# Patient Record
Sex: Female | Born: 1990 | Race: White | Hispanic: Yes | Marital: Single | Smoking: Never smoker
Health system: Southern US, Community
[De-identification: ages and names within clinical notes are randomized; demographics above are authoritative.]

## PROBLEM LIST (undated history)

## (undated) ENCOUNTER — Inpatient Hospital Stay (HOSPITAL_COMMUNITY): Payer: Self-pay

## (undated) DIAGNOSIS — E119 Type 2 diabetes mellitus without complications: Secondary | ICD-10-CM

## (undated) DIAGNOSIS — Z789 Other specified health status: Secondary | ICD-10-CM

## (undated) DIAGNOSIS — O1490 Unspecified pre-eclampsia, unspecified trimester: Secondary | ICD-10-CM

## (undated) DIAGNOSIS — F419 Anxiety disorder, unspecified: Secondary | ICD-10-CM

## (undated) HISTORY — PX: NO PAST SURGERIES: SHX2092

## (undated) HISTORY — PX: TUBAL LIGATION: SHX77

---
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
## 2010-02-12 ENCOUNTER — Emergency Department (HOSPITAL_COMMUNITY)
Admission: EM | Admit: 2010-02-12 | Discharge: 2010-02-12 | Payer: Self-pay | Source: Home / Self Care | Admitting: Emergency Medicine

## 2010-02-15 IMAGING — US US RENAL
1 series · 14 of 25 positions shown · non-contrast
Comparison: None available.

CLINICAL DATA: Pyelonephritis.

RENAL/URINARY TRACT ULTRASOUND COMPLETE

[Series 1: us renal · 0.22mm/px · 14 of 38 slices shown]
[im 1/38]
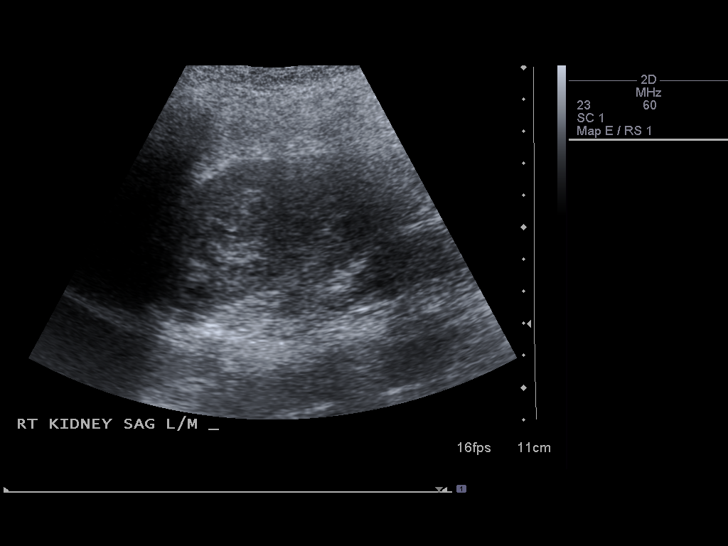
[im 4/38]
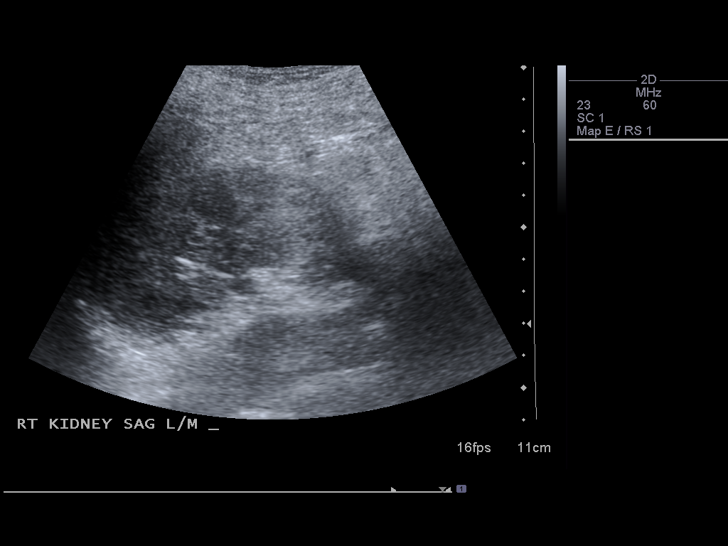
[im 7/38]
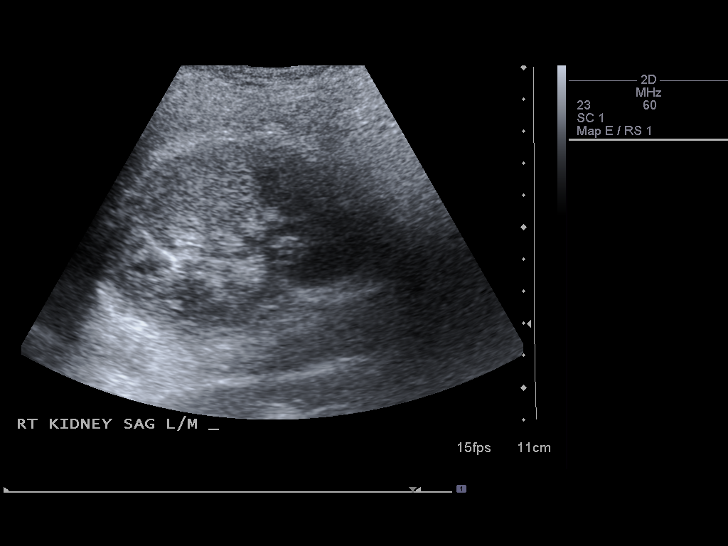
[im 10/38]
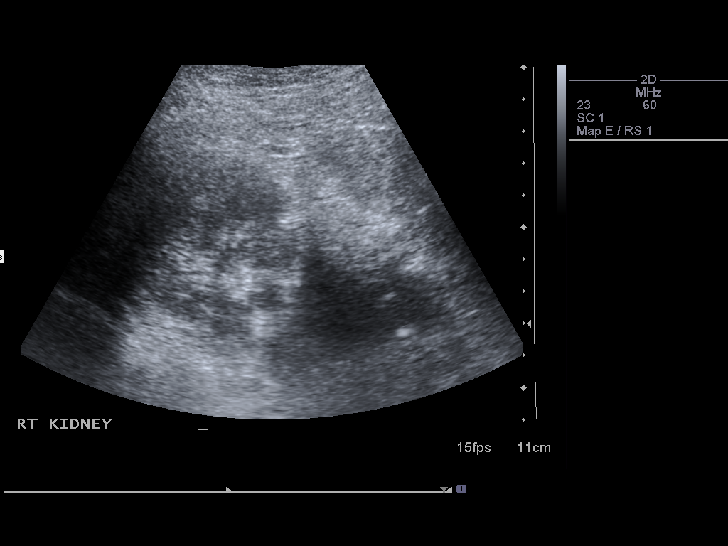
[im 13/38]
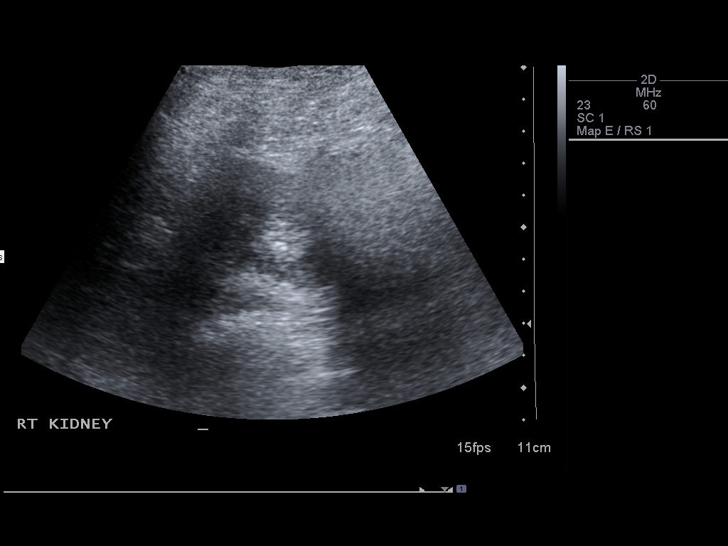
[im 14/38]
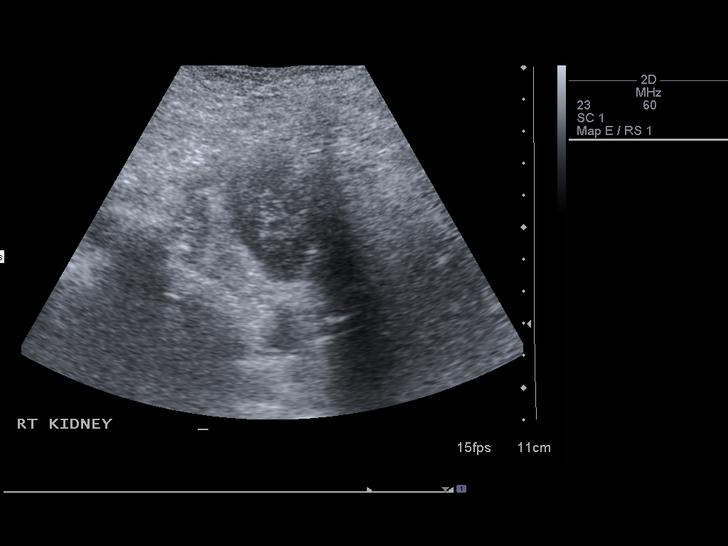
[im 17/38]
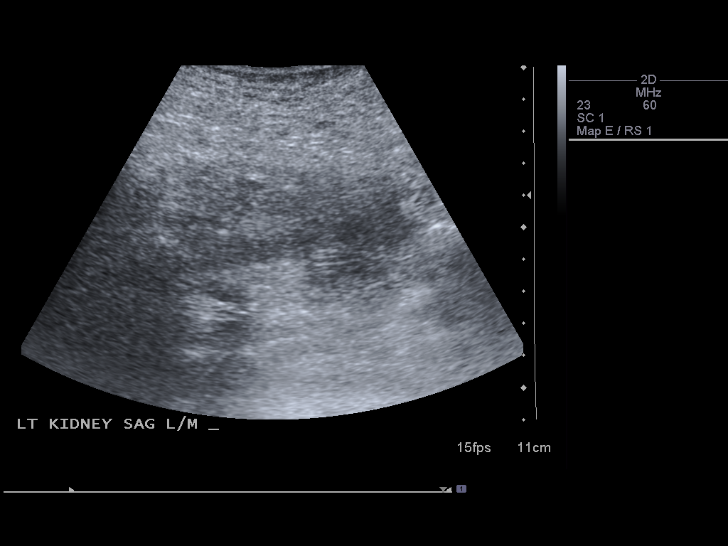
[im 21/38]
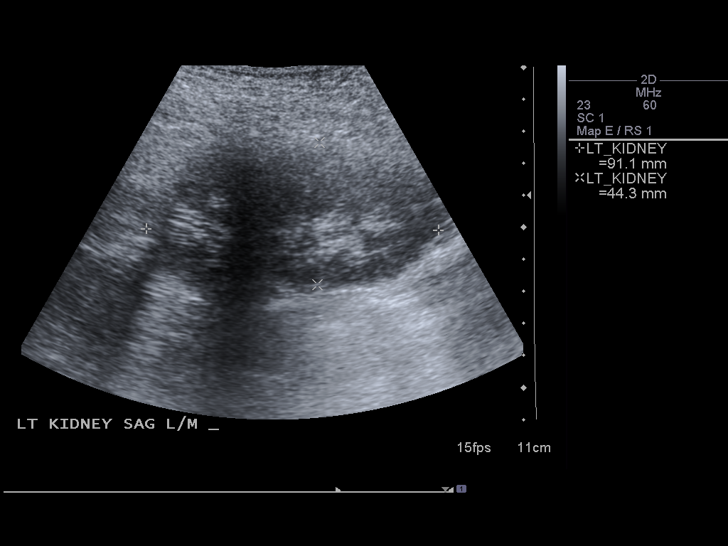
[im 24/38]
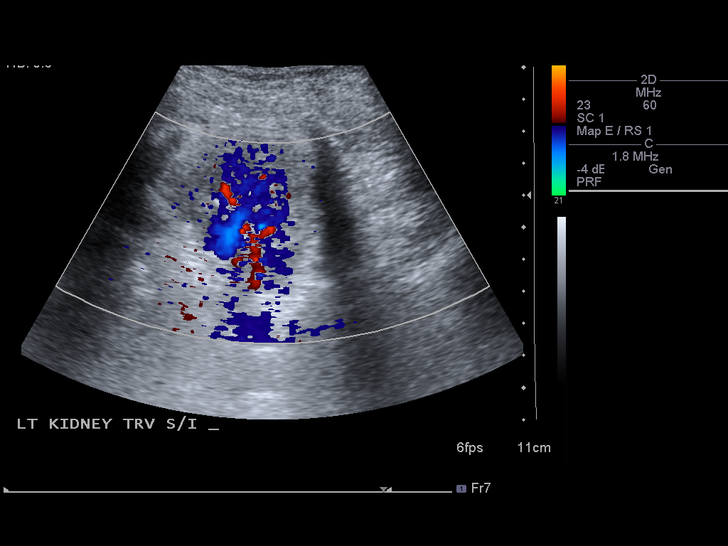
[im 25/38]
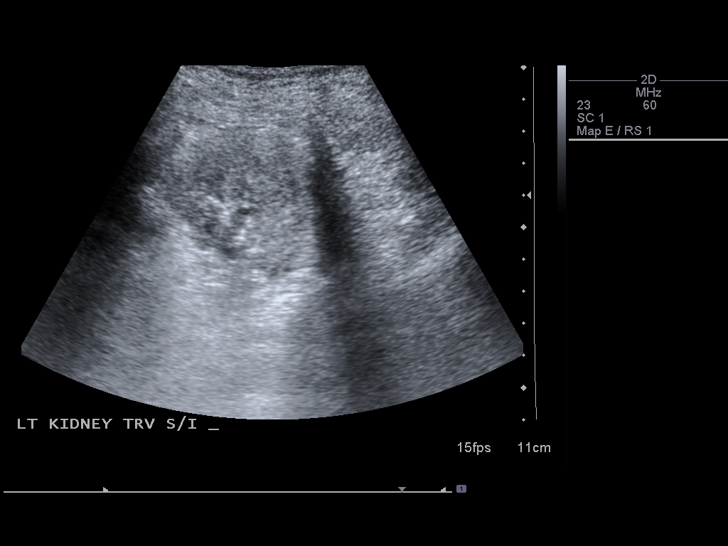
[im 28/38]
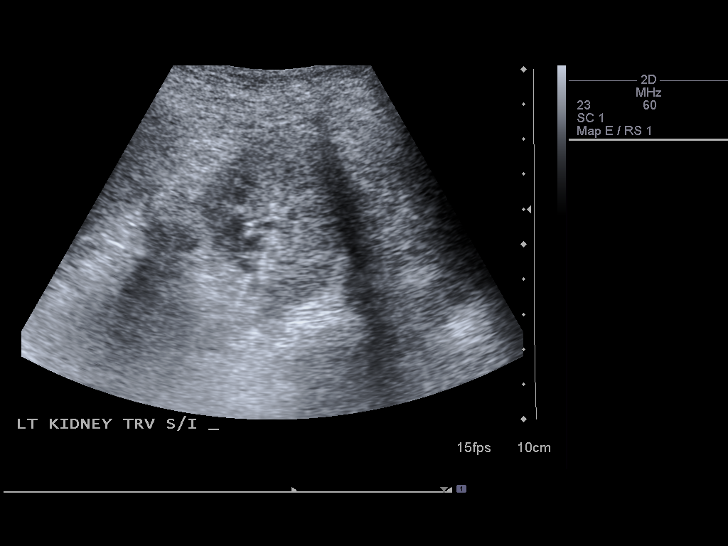
[im 31/38]
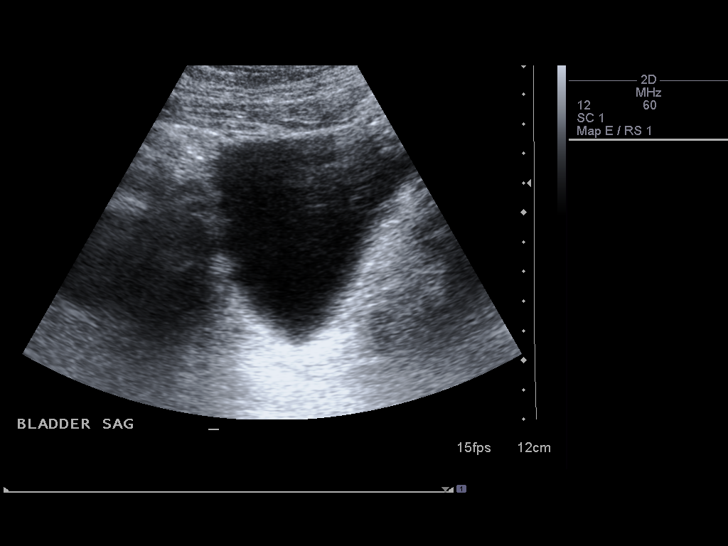
[im 34/38]
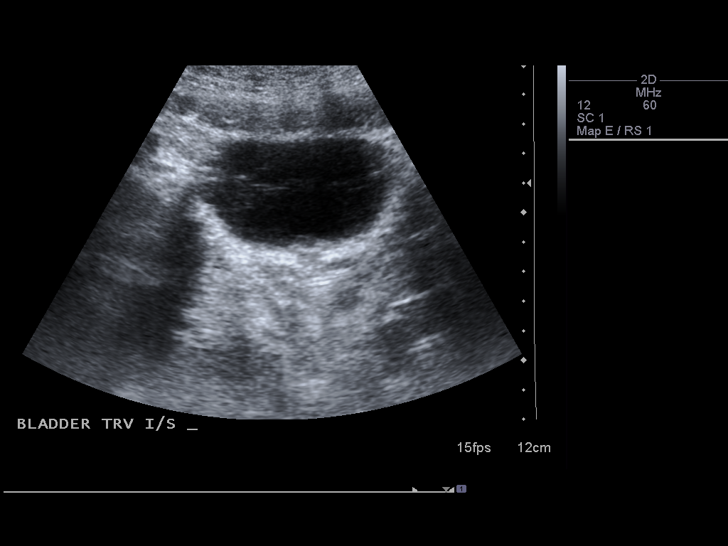
[im 38/38]
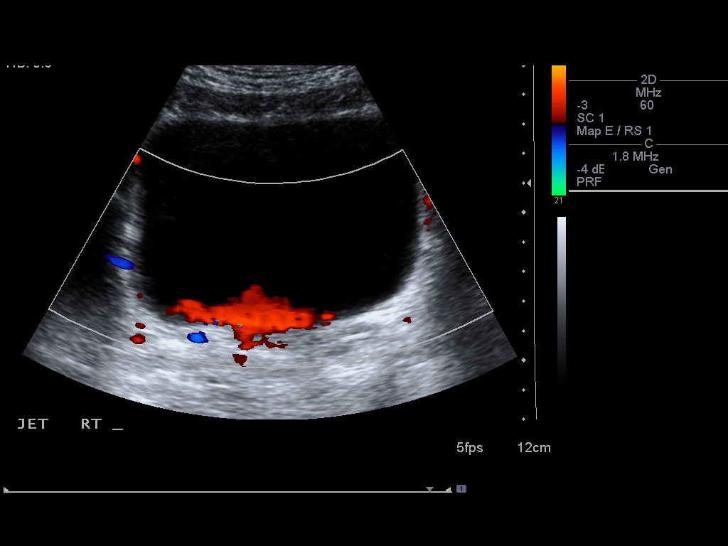

[14 of 25 positions shown; findings below may reference images not displayed]

FINDINGS: Right Kidney:  Measures 8.7 cm.  No stones, masses or
hydronephrosis.

Left Kidney:  Measures 9.1 cm.  No stones, masses or
hydronephrosis.

Bladder:  Bilateral ureteral jets are identified.  Urinary bladder
is unremarkable.
IMPRESSION: No acute finding.

## 2010-02-15 IMAGING — CT CT HEAD W/O CM
1 series · 16 of 30 positions shown, 20 images · non-contrast
Comparison: None

CLINICAL DATA: Headache, nausea, vomiting, lumbar puncture 1 day
ago for fever and stiff neck

CT HEAD WITHOUT CONTRAST
TECHNIQUE: Contiguous axial images were obtained from the base of
the skull through the vertex without contrast.

[Series 2: head 4.8 h37s · axial · 0.42mm/px · z∈[+1105,+1238]mm · 16 of 32 slices shown, 20 images]
[im 2/32  brain]
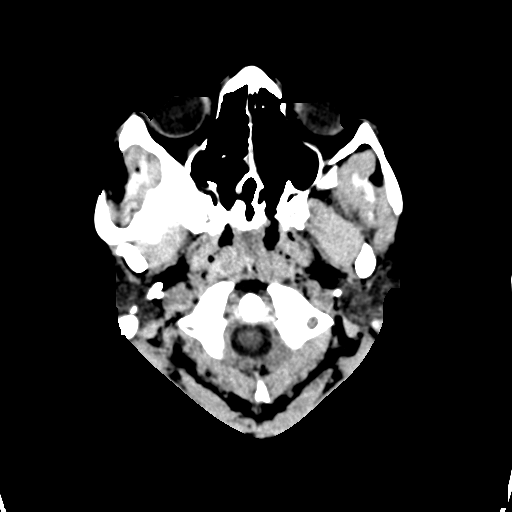
[im 2/32  bone]
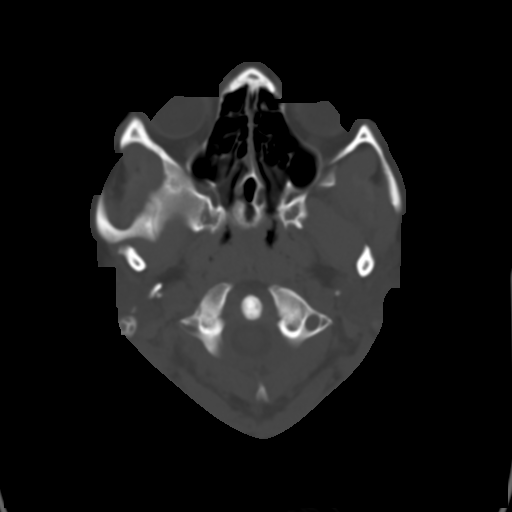
[im 4/32  brain]
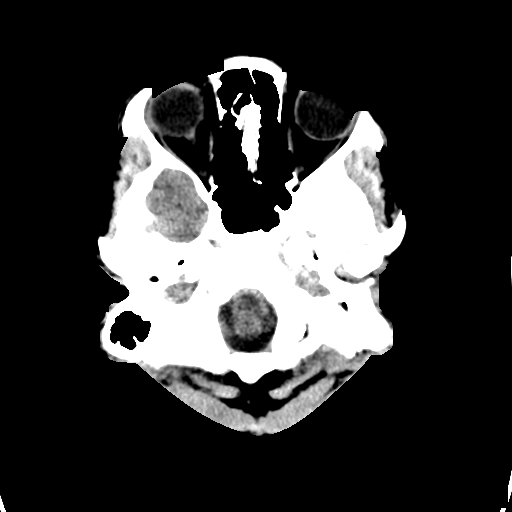
[im 6/32  brain]
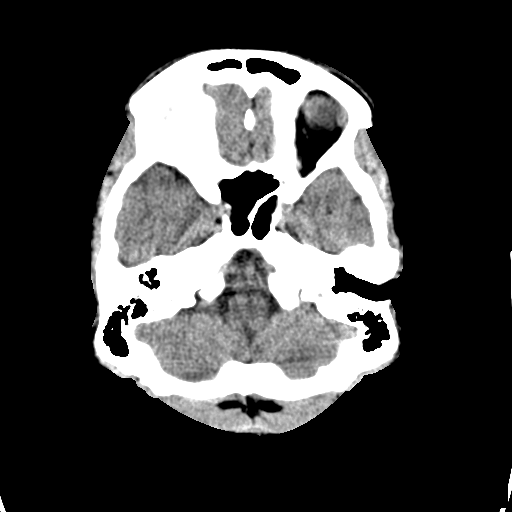
[im 8/32  brain]
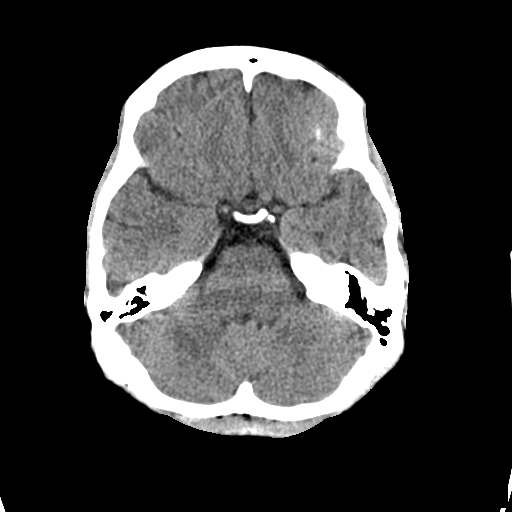
[im 9/32  brain]
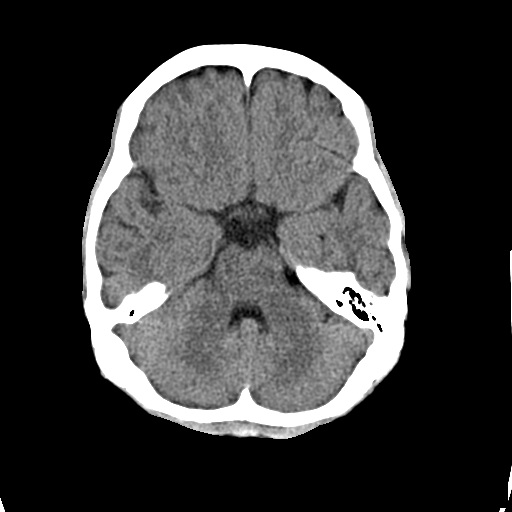
[im 9/32  bone]
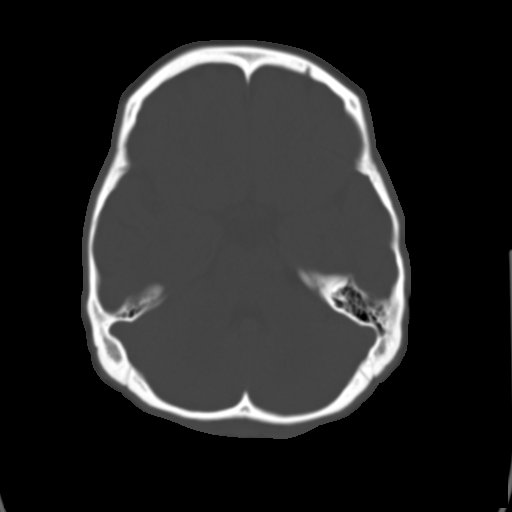
[im 11/32  brain]
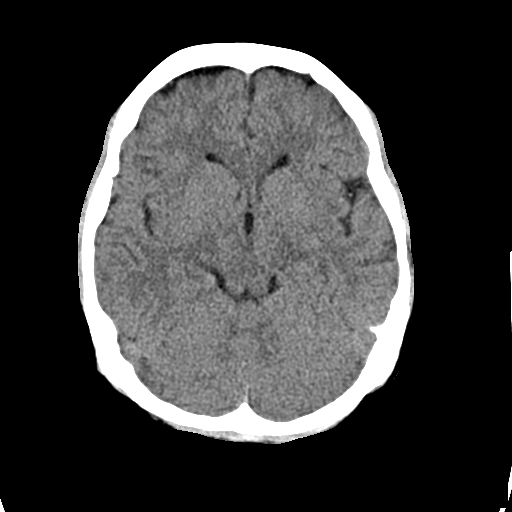
[im 13/32  brain]
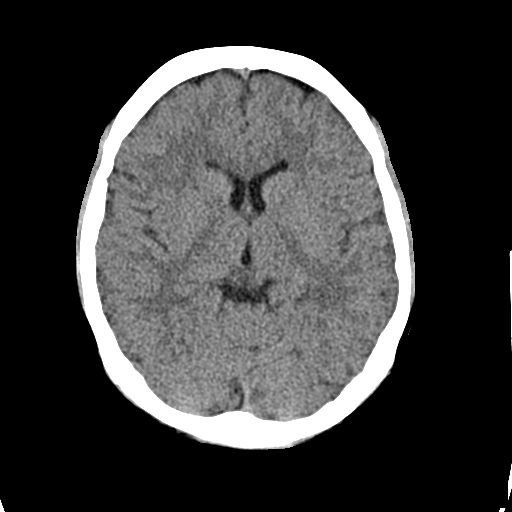
[im 15/32  brain]
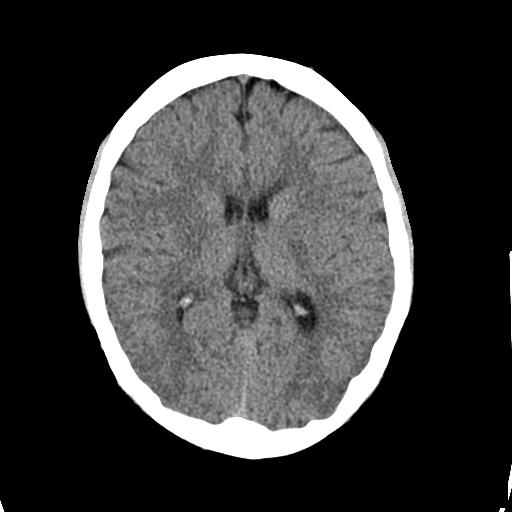
[im 17/32  brain]
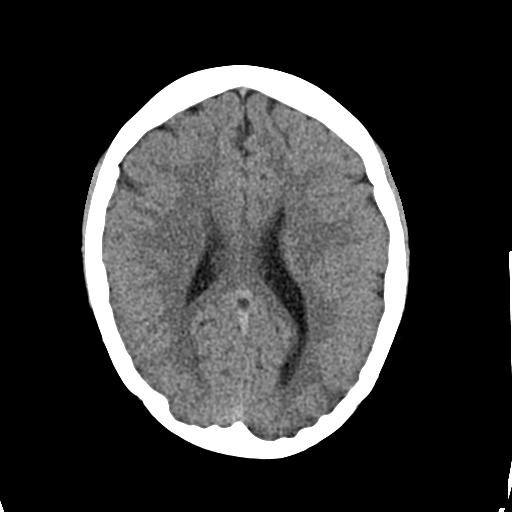
[im 17/32  bone]
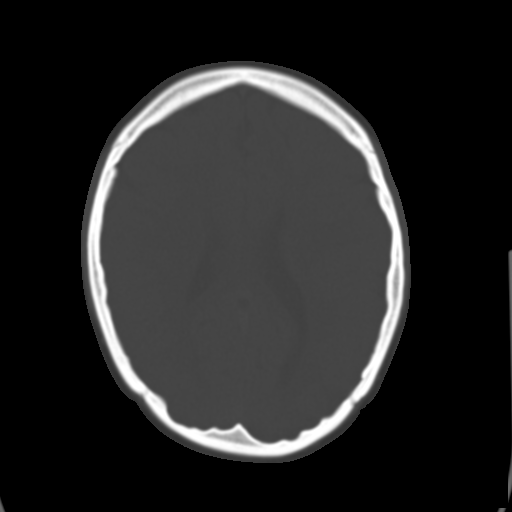
[im 19/32  brain]
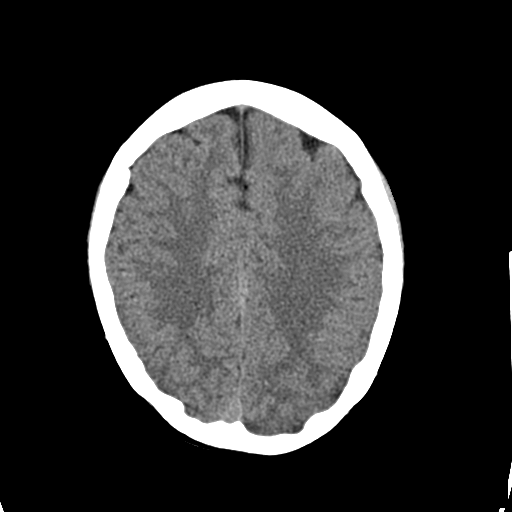
[im 21/32  brain]
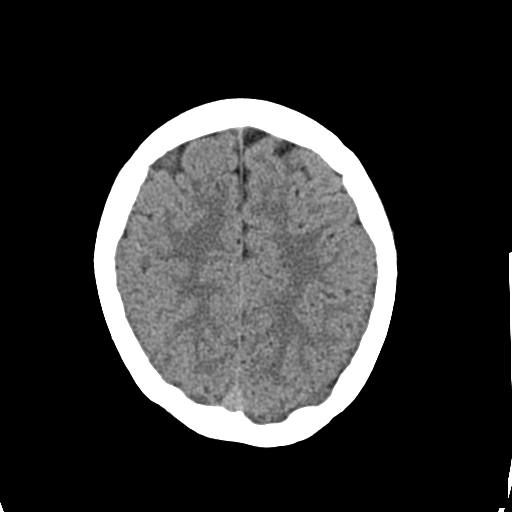
[im 23/32  brain]
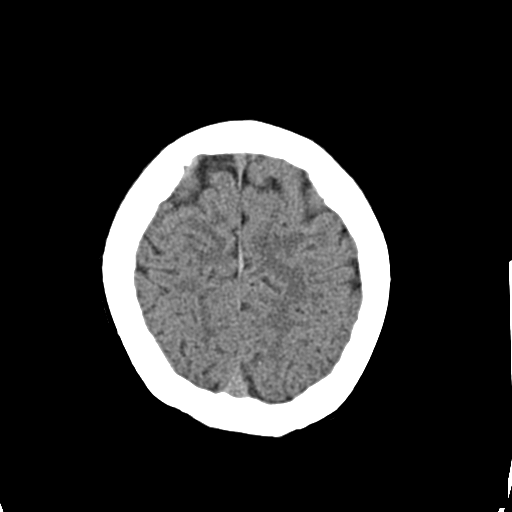
[im 24/32  brain]
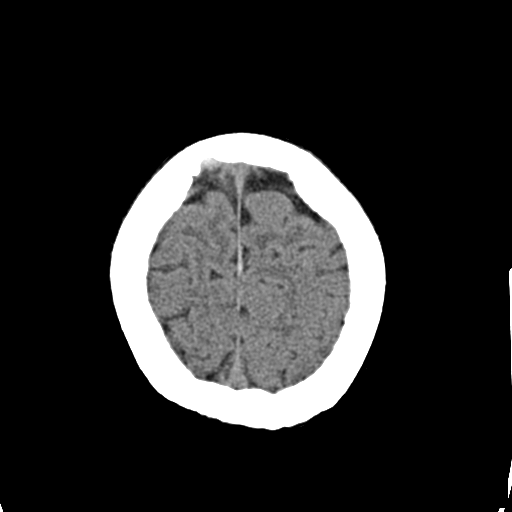
[im 24/32  bone]
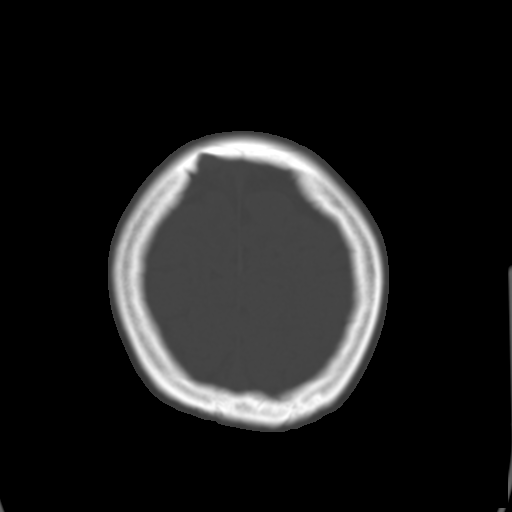
[im 26/32  brain]
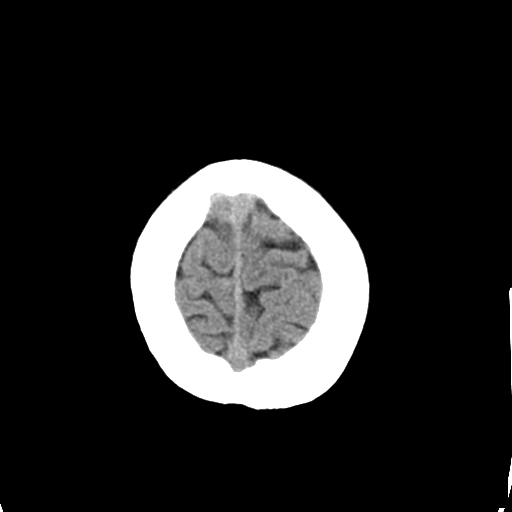
[im 28/32  brain]
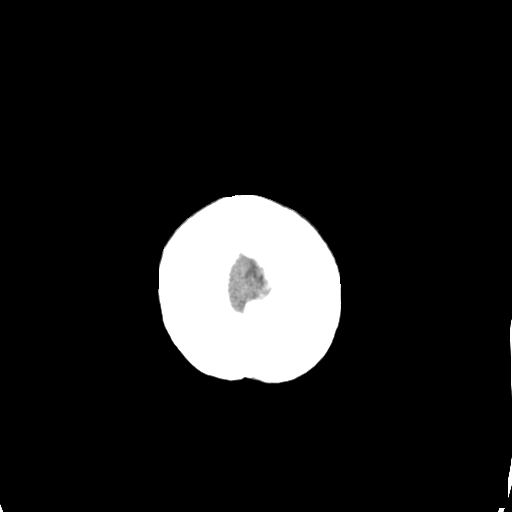
[im 30/32  brain]
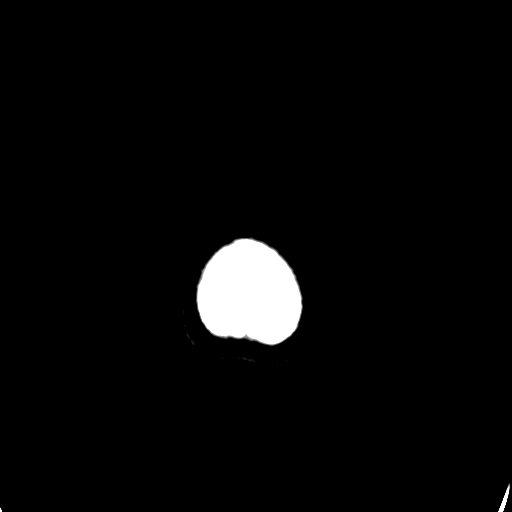

[16 of 30 positions shown; findings below may reference images not displayed]

FINDINGS: Normal ventricular morphology.
No midline shift or mass effect.
Normal appearance of brain parenchyma.
No intracranial hemorrhage, mass lesion, or extra-axial fluid
collection.
Visualized paranasal sinuses and mastoid air cells clear.
Bones unremarkable.
IMPRESSION: No acute intracranial abnormalities.

## 2010-05-21 LAB — CSF CELL COUNT WITH DIFFERENTIAL: WBC, CSF: 2 /mm3 (ref 0–5)

## 2010-05-21 LAB — URINE MICROSCOPIC-ADD ON

## 2010-05-21 LAB — CBC
HCT: 39.8 % (ref 36.0–49.0)
HCT: 41.5 % (ref 36.0–49.0)
Hemoglobin: 13.5 g/dL (ref 12.0–16.0)
MCHC: 33.6 g/dL (ref 31.0–37.0)
MCV: 85.4 fL (ref 78.0–98.0)
Platelets: 280 10*3/uL (ref 150–400)
RBC: 4.71 MIL/uL (ref 3.80–5.70)
WBC: 4.6 10*3/uL (ref 4.5–13.5)
WBC: 9.4 10*3/uL (ref 4.5–13.5)

## 2010-05-21 LAB — DIFFERENTIAL
Basophils Absolute: 0.1 10*3/uL (ref 0.0–0.1)
Basophils Relative: 1 % (ref 0–1)
Basophils Relative: 1 % (ref 0–1)
Eosinophils Absolute: 0 10*3/uL (ref 0.0–1.2)
Eosinophils Absolute: 0 10*3/uL (ref 0.0–1.2)
Eosinophils Relative: 1 % (ref 0–5)
Lymphs Abs: 1.4 10*3/uL (ref 1.1–4.8)
Monocytes Relative: 1 % — ABNORMAL LOW (ref 3–11)
Monocytes Relative: 8 % (ref 3–11)
Neutrophils Relative %: 58 % (ref 43–71)
Neutrophils Relative %: 91 % — ABNORMAL HIGH (ref 43–71)

## 2010-05-21 LAB — GLUCOSE, CSF: Glucose, CSF: 64 mg/dL (ref 43–76)

## 2010-05-21 LAB — URINALYSIS, ROUTINE W REFLEX MICROSCOPIC
Bilirubin Urine: NEGATIVE
Bilirubin Urine: NEGATIVE
Glucose, UA: NEGATIVE mg/dL
Glucose, UA: NEGATIVE mg/dL
Ketones, ur: 15 mg/dL — AB
Ketones, ur: NEGATIVE mg/dL
Nitrite: NEGATIVE
Protein, ur: NEGATIVE mg/dL
Protein, ur: NEGATIVE mg/dL
pH: 6.5 (ref 5.0–8.0)
pH: 7 (ref 5.0–8.0)

## 2010-05-21 LAB — WET PREP, GENITAL
Clue Cells Wet Prep HPF POC: NONE SEEN
Yeast Wet Prep HPF POC: NONE SEEN

## 2010-05-21 LAB — BASIC METABOLIC PANEL
BUN: 10 mg/dL (ref 6–23)
CO2: 28 mEq/L (ref 19–32)
Chloride: 100 mEq/L (ref 96–112)
Creatinine, Ser: 0.7 mg/dL (ref 0.4–1.2)
Potassium: 4 mEq/L (ref 3.5–5.1)

## 2010-05-21 LAB — COMPREHENSIVE METABOLIC PANEL
ALT: 20 U/L (ref 0–35)
Alkaline Phosphatase: 91 U/L (ref 47–119)
BUN: 11 mg/dL (ref 6–23)
CO2: 23 mEq/L (ref 19–32)
Chloride: 102 mEq/L (ref 96–112)
Glucose, Bld: 166 mg/dL — ABNORMAL HIGH (ref 70–99)
Potassium: 4.2 mEq/L (ref 3.5–5.1)
Sodium: 141 mEq/L (ref 135–145)
Total Bilirubin: 0.4 mg/dL (ref 0.3–1.2)
Total Protein: 8.2 g/dL (ref 6.0–8.3)

## 2010-05-21 LAB — PREGNANCY, URINE
Preg Test, Ur: NEGATIVE
Preg Test, Ur: NEGATIVE

## 2010-05-21 LAB — URINE CULTURE

## 2010-05-21 LAB — PROTEIN, CSF: Total  Protein, CSF: 17 mg/dL (ref 15–45)

## 2010-05-21 LAB — CULTURE, BLOOD (ROUTINE X 2)

## 2011-02-16 NOTE — L&D Delivery Note (Signed)
April Garrett is a 21 y.o. G2P0100 who was induced today 2/2 H/O IUFD. Induction progressed normally throughout the day.   At 2337 a single viable female infant weighing 7-2.1with apgars 8/9. Baby was born LOA with mother in lithotomy. Tight nuchal X 1, baby was somersaulted through. Baby to maternal abdomen for drying and stimulation with spontaneous lusty cry.  Mother with intact perineum. Placenta born at 2348. Fundus boggy, but firmed up with massage. Large clots passed. Cytotec PR given. EBL:400 cc  Mom and baby in stable condition.   Thressa Sheller, CNM

## 2011-04-23 LAB — OB RESULTS CONSOLE HEPATITIS B SURFACE ANTIGEN: Hepatitis B Surface Ag: NEGATIVE

## 2011-04-23 LAB — OB RESULTS CONSOLE ABO/RH: RH Type: POSITIVE

## 2011-04-23 LAB — OB RESULTS CONSOLE RUBELLA ANTIBODY, IGM: Rubella: IMMUNE

## 2011-06-01 LAB — OB RESULTS CONSOLE GC/CHLAMYDIA: Chlamydia: NEGATIVE

## 2011-11-04 ENCOUNTER — Inpatient Hospital Stay (HOSPITAL_COMMUNITY)
Admission: AD | Admit: 2011-11-04 | Discharge: 2011-11-04 | Disposition: A | Discharge status: Home / Self Care | Payer: Medicaid Other | Source: Ambulatory Visit | Attending: Obstetrics & Gynecology | Admitting: Obstetrics & Gynecology

## 2011-11-04 ENCOUNTER — Encounter (HOSPITAL_COMMUNITY): Payer: Self-pay

## 2011-11-04 DIAGNOSIS — R109 Unspecified abdominal pain: Secondary | ICD-10-CM | POA: Insufficient documentation

## 2011-11-04 DIAGNOSIS — O479 False labor, unspecified: Secondary | ICD-10-CM

## 2011-11-04 DIAGNOSIS — M545 Low back pain, unspecified: Secondary | ICD-10-CM | POA: Insufficient documentation

## 2011-11-04 DIAGNOSIS — E860 Dehydration: Secondary | ICD-10-CM | POA: Insufficient documentation

## 2011-11-04 DIAGNOSIS — O99891 Other specified diseases and conditions complicating pregnancy: Secondary | ICD-10-CM | POA: Insufficient documentation

## 2011-11-04 HISTORY — DX: Other specified health status: Z78.9

## 2011-11-04 LAB — URINE MICROSCOPIC-ADD ON

## 2011-11-04 LAB — URINALYSIS, ROUTINE W REFLEX MICROSCOPIC
Bilirubin Urine: NEGATIVE
Glucose, UA: NEGATIVE mg/dL
Ketones, ur: 15 mg/dL — AB
Protein, ur: 30 mg/dL — AB

## 2011-11-04 MED ORDER — LACTATED RINGERS IV BOLUS (SEPSIS)
1000.0000 mL | Freq: Once | INTRAVENOUS | Status: AC
  Administered 2011-11-04: 1000 mL via INTRAVENOUS

## 2011-11-04 NOTE — MAU Provider Note (Signed)
  History     CSN: [id number]  Arrival date and time: 11/04/11 0426   None     Chief Complaint  Patient presents with  . Abdominal Pain  . Back Pain   HPI Comments: 20yo G1P0100 @ 36.5 p/w lower back and abdominal pain since this weekend. Patient reports going to [facility] on Saturday (5 days ago) for low back pain and contractions as well as some LOF.  Was told that her cervix was closed and the fluid was most likely urine and sent home.  Pt continued to have the contraction-like pains and low back pains and decided to be evaluated.  No vaginal bleeding. + FM. Care is at [facility] and next appointment is tomorrow.  Feels like her pain is doing better after the IVF and feels ready to go home.   OB History    Grav Para Term Preterm Abortions TAB SAB Ect Mult Living   2 1  1       0      Past Medical History  Diagnosis Date  . No pertinent past medical history     Past Surgical History  Procedure Date  . No past surgeries     Family History  Problem Relation Age of Onset  . Other Neg Hx     History  Substance Use Topics  . Smoking status: Never Smoker   . Smokeless tobacco: Not on file  . Alcohol Use: No    Allergies: No Known Allergies  Prescriptions prior to admission  Medication Sig Dispense Refill  . Prenatal Vit-Fe Fumarate-FA (PRENATAL MULTIVITAMIN) TABS Take 1 tablet by mouth daily.        Review of Systems  Constitutional: Negative for fever.  Eyes: Negative for blurred vision.  Respiratory: Negative for cough and shortness of breath.   Cardiovascular: Negative for chest pain.  Gastrointestinal: Positive for abdominal pain. Negative for nausea and vomiting.  Genitourinary: Negative for dysuria.  Musculoskeletal: Positive for back pain. Negative for falls.  Neurological: Negative for dizziness and headaches.   Physical Exam   Blood pressure 102/74, pulse 90, temperature 96.8 F (36 C), temperature source Oral, resp. rate 18, height 4\' 11"  (1.499  m), weight 62.596 kg (138 lb).  Physical Exam  Constitutional: She appears well-developed and well-nourished. No distress.  Cardiovascular: Normal rate, regular rhythm, normal heart sounds and intact distal pulses.   No murmur heard. Respiratory: Effort normal and breath sounds normal. No respiratory distress.  GI: There is no tenderness.       gravid  Musculoskeletal: She exhibits no edema.  Skin: Skin is warm and dry.   FHT: 135, mod variability, + accels, no decels Toco: irritability, improved s/p fluids  Dilation: Closed Effacement (%): Thick Cervical Position: Posterior Station: -3 Exam by:: Lucy Chris RNC   MAU Course  Procedures  MDM C/o low back and abdominal pain; dehydrated by UA; + irritability on toco -Check cervix now, then again in 1-2 hours to ensure no change -Fluid bolus  06:30- cerix unchanged, back/abd pain and irritability on toco improved s/p blus -Send urine for culture  Assessment and Plan  20 yo G2P0100 @ 36.5 p/w uterine irritability, low back/abdominal pain and mild dehydration -D/c home with labor precautions, kick counts -Encourage po fluids -F/u urine culture  Vanesa Renier 11/04/2011, 6:23 AM

## 2011-11-04 NOTE — MAU Note (Signed)
Lower abdominal pain & back pain that patient thinks are contractions. Has been happening since Saturday. Was seen at [facility] hospital on Saturday and was told not in labor. States contractions aren't as frequent as they were Saturday, can't tell how many contractions per hour she is having. Denies vaginal bleeding. Positive fetal movement.

## 2011-11-05 LAB — URINE CULTURE: Colony Count: 9000

## 2011-11-09 NOTE — MAU Provider Note (Signed)
I have seen and examined this patient and agree the above assessment. CRESENZO-DISHMAN,Denym Rahimi 11/09/2011 2:07 PM

## 2011-11-16 LAB — OB RESULTS CONSOLE GBS: GBS: POSITIVE

## 2011-11-20 ENCOUNTER — Inpatient Hospital Stay (HOSPITAL_COMMUNITY)
Admission: RE | Admit: 2011-11-20 | Discharge: 2011-11-23 | DRG: 372 | Disposition: A | Discharge status: Home / Self Care | Payer: BC Managed Care – PPO | Source: Ambulatory Visit | Attending: Obstetrics & Gynecology | Admitting: Obstetrics & Gynecology

## 2011-11-20 ENCOUNTER — Encounter (HOSPITAL_COMMUNITY): Payer: Self-pay

## 2011-11-20 DIAGNOSIS — Z22330 Carrier of Group B streptococcus: Secondary | ICD-10-CM

## 2011-11-20 DIAGNOSIS — O98519 Other viral diseases complicating pregnancy, unspecified trimester: Principal | ICD-10-CM | POA: Diagnosis present

## 2011-11-20 DIAGNOSIS — O99892 Other specified diseases and conditions complicating childbirth: Secondary | ICD-10-CM | POA: Diagnosis present

## 2011-11-20 DIAGNOSIS — A630 Anogenital (venereal) warts: Secondary | ICD-10-CM | POA: Diagnosis present

## 2011-11-20 LAB — CBC
MCV: 76.5 fL — ABNORMAL LOW (ref 78.0–100.0)
Platelets: 209 10*3/uL (ref 150–400)
RBC: 4.29 MIL/uL (ref 3.87–5.11)
WBC: 9.9 10*3/uL (ref 4.0–10.5)

## 2011-11-20 MED ORDER — MISOPROSTOL 25 MCG QUARTER TABLET
25.0000 ug | ORAL_TABLET | ORAL | Status: DC | PRN
  Administered 2011-11-20 – 2011-11-21 (×2): 25 ug via VAGINAL
  Filled 2011-11-20 (×2): qty 0.25

## 2011-11-20 MED ORDER — CITRIC ACID-SODIUM CITRATE 334-500 MG/5ML PO SOLN: 30.0000 mL | ORAL | Status: DC | PRN

## 2011-11-20 MED ORDER — IBUPROFEN 600 MG PO TABS: 600.0000 mg | ORAL_TABLET | Freq: Four times a day (QID) | ORAL | Status: DC | PRN

## 2011-11-20 MED ORDER — OXYTOCIN BOLUS FROM INFUSION
500.0000 mL | Freq: Once | INTRAVENOUS | Status: AC
  Administered 2011-11-21: 500 mL via INTRAVENOUS
  Filled 2011-11-20: qty 500

## 2011-11-20 MED ORDER — FLEET ENEMA 7-19 GM/118ML RE ENEM: 1.0000 | ENEMA | RECTAL | Status: DC | PRN

## 2011-11-20 MED ORDER — TERBUTALINE SULFATE 1 MG/ML IJ SOLN: 0.2500 mg | Freq: Once | INTRAMUSCULAR | Status: AC | PRN

## 2011-11-20 MED ORDER — ONDANSETRON HCL 4 MG/2ML IJ SOLN: 4.0000 mg | Freq: Four times a day (QID) | INTRAMUSCULAR | Status: DC | PRN

## 2011-11-20 MED ORDER — OXYCODONE-ACETAMINOPHEN 5-325 MG PO TABS: 1.0000 | ORAL_TABLET | ORAL | Status: DC | PRN

## 2011-11-20 MED ORDER — LIDOCAINE HCL (PF) 1 % IJ SOLN
30.0000 mL | INTRAMUSCULAR | Status: DC | PRN
  Filled 2011-11-20: qty 30

## 2011-11-20 MED ORDER — HYDROXYZINE HCL 50 MG PO TABS: 50.0000 mg | ORAL_TABLET | Freq: Four times a day (QID) | ORAL | Status: DC | PRN

## 2011-11-20 MED ORDER — HYDROXYZINE HCL 50 MG/ML IM SOLN
50.0000 mg | Freq: Four times a day (QID) | INTRAMUSCULAR | Status: DC | PRN
  Filled 2011-11-20: qty 1

## 2011-11-20 MED ORDER — NALBUPHINE SYRINGE 5 MG/0.5 ML
5.0000 mg | INJECTION | INTRAMUSCULAR | Status: DC | PRN
  Administered 2011-11-21 (×3): 5 mg via INTRAVENOUS
  Filled 2011-11-20 (×3): qty 0.5

## 2011-11-20 MED ORDER — OXYTOCIN 40 UNITS IN LACTATED RINGERS INFUSION - SIMPLE MED: 62.5000 mL/h | Freq: Once | INTRAVENOUS | Status: DC

## 2011-11-20 MED ORDER — ACETAMINOPHEN 325 MG PO TABS: 650.0000 mg | ORAL_TABLET | ORAL | Status: DC | PRN

## 2011-11-20 MED ORDER — LACTATED RINGERS IV SOLN
INTRAVENOUS | Status: DC
  Administered 2011-11-21: 125 mL via INTRAVENOUS

## 2011-11-20 MED ORDER — LACTATED RINGERS IV SOLN
500.0000 mL | INTRAVENOUS | Status: DC | PRN
  Administered 2011-11-21: 500 mL via INTRAVENOUS

## 2011-11-20 NOTE — H&P (Signed)
April Garrett is a 21 y.o. G39P0100 female at [redacted]w[redacted]d by 1st trimester u/s presenting for IOL d/t h/o 36wk IUFD.  Began pnc @ [facility] @ 8wk6d and has had regular care throughout pregnancy, has been followed w/ 2x/weekly testing since 32weeks. Was treated for +chlamydia during pregnancy and had neg poc.  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 2hr GTT and Integrated screening wnl.  Maternal Medical History:  Reason for admission: IOL for h/o 36wk IUFD  Fetal activity: Perceived fetal activity is normal.   Last perceived fetal movement was within the past hour.    Prenatal complications: +Chlamydia with w/ POC neg Perianal condyloma tx w/ TCA during pregnancy  Prenatal Complications - Diabetes: none.    OB History    Grav Para Term Preterm Abortions TAB SAB Ect Mult Living   2 1  1       0     Past Medical History  Diagnosis Date  . No pertinent past medical history    Past Surgical History  Procedure Date  . No past surgeries    Family History: family history is negative for Other. Social History:  reports that she has never smoked. She does not have any smokeless tobacco history on file. She reports that she does not drink alcohol or use illicit drugs.   Prenatal Transfer Tool  Maternal Diabetes: No Genetic Screening: Normal Maternal Ultrasounds/Referrals: Normal Fetal Ultrasounds or other Referrals:  None Maternal Substance Abuse:  No Significant Maternal Medications:  None Significant Maternal Lab Results:  Lab values include: Group B Strep positive Other Comments:  None  Review of Systems  Constitutional: Negative.   HENT: Negative.   Eyes: Negative.   Respiratory: Negative.   Cardiovascular: Negative.   Gastrointestinal: Negative.   Genitourinary: Negative.   Musculoskeletal: Negative.   Skin: Negative.   Neurological: Negative.   Endo/Heme/Allergies: Negative.   Psychiatric/Behavioral: Negative.     Dilation: 1 Effacement (%):  Thick Station: -3 Exam by:: Davis,RN Blood pressure 121/80, pulse 96, temperature 98.7 F (37.1 C), temperature source Oral. Maternal Exam:  Uterine Assessment: Contraction strength is mild.  Contraction frequency is irregular.   Abdomen: Fetal presentation: vertex  Introitus: Vulva is positive for vulval condylomata (perianal). Normal vagina.  Pelvis: adequate for delivery.   Cervix: Cervix evaluated by digital exam.     Fetal Exam Fetal Monitor Review: Mode: ultrasound.   Baseline rate: 145.  Variability: moderate (6-25 bpm).   Pattern: accelerations present and no decelerations.    Fetal State Assessment: Category I - tracings are normal.     Physical Exam  Constitutional: She is oriented to person, place, and time. She appears well-developed and well-nourished.  HENT:  Head: Normocephalic.  Neck: Normal range of motion.  Cardiovascular: Normal rate and regular rhythm.   Respiratory: Effort normal.  GI: Soft.       gravid  Genitourinary: Vagina normal.       Perianal condyloma SVE 1/th/-3, vtx  Musculoskeletal: Normal range of motion. She exhibits edema.  Neurological: She is alert and oriented to person, place, and time. She has normal reflexes.  Skin: Skin is warm and dry.  Psychiatric: She has a normal mood and affect. Her behavior is normal. Judgment and thought content normal.    Prenatal labs: ABO, Rh: O/Positive/-- (03/08 0000) Antibody: Negative (03/08 0000) Rubella: Immune (03/08 0000) RPR: Nonreactive (03/08 0000)  HBsAg: Negative (03/08 0000)  HIV: Non-reactive (03/08 0000)  GBS: Positive (10/01 0000)   Assessment/Plan:  A:  [redacted]w[redacted]d SIUP  Cat I FHR  GBS pos  IOL d/t h/o 36wk IUFD  Perianal condyloma  P:  Admit to BS  Cytotec per protocol until labor or able to place cervical foley bulb  PCN per protocol when active labor begins for gbs+  Anticipate NSVD   Discussed pt & poc w/ Dr. Glee Arvin, Cheron Every 11/20/2011, 9:56  PM

## 2011-11-20 NOTE — Progress Notes (Signed)
Dr. Jean Rosenthal notifed of pt PPH high risk

## 2011-11-21 ENCOUNTER — Inpatient Hospital Stay (HOSPITAL_COMMUNITY): Payer: BC Managed Care – PPO | Admitting: Anesthesiology

## 2011-11-21 ENCOUNTER — Encounter (HOSPITAL_COMMUNITY): Payer: Self-pay | Admitting: Anesthesiology

## 2011-11-21 MED ORDER — FENTANYL 2.5 MCG/ML BUPIVACAINE 1/10 % EPIDURAL INFUSION (WH - ANES)
14.0000 mL/h | INTRAMUSCULAR | Status: DC
  Filled 2011-11-21: qty 123

## 2011-11-21 MED ORDER — LACTATED RINGERS IV SOLN: 500.0000 mL | Freq: Once | INTRAVENOUS | Status: DC

## 2011-11-21 MED ORDER — TERBUTALINE SULFATE 1 MG/ML IJ SOLN: 0.2500 mg | Freq: Once | INTRAMUSCULAR | Status: AC | PRN

## 2011-11-21 MED ORDER — PHENYLEPHRINE 40 MCG/ML (10ML) SYRINGE FOR IV PUSH (FOR BLOOD PRESSURE SUPPORT): 80.0000 ug | PREFILLED_SYRINGE | INTRAVENOUS | Status: DC | PRN

## 2011-11-21 MED ORDER — OXYTOCIN 40 UNITS IN LACTATED RINGERS INFUSION - SIMPLE MED
1.0000 m[IU]/min | INTRAVENOUS | Status: DC
  Administered 2011-11-21: 2 m[IU]/min via INTRAVENOUS
  Filled 2011-11-21: qty 1000

## 2011-11-21 MED ORDER — PENICILLIN G POTASSIUM 5000000 UNITS IJ SOLR
2.5000 10*6.[IU] | INTRAVENOUS | Status: DC
  Administered 2011-11-21 (×3): 2.5 10*6.[IU] via INTRAVENOUS
  Filled 2011-11-21 (×7): qty 2.5

## 2011-11-21 MED ORDER — DIPHENHYDRAMINE HCL 50 MG/ML IJ SOLN: 12.5000 mg | INTRAMUSCULAR | Status: DC | PRN

## 2011-11-21 MED ORDER — EPHEDRINE 5 MG/ML INJ
10.0000 mg | INTRAVENOUS | Status: DC | PRN
  Filled 2011-11-21: qty 4

## 2011-11-21 MED ORDER — FENTANYL CITRATE 0.05 MG/ML IJ SOLN
100.0000 ug | INTRAMUSCULAR | Status: DC | PRN
  Administered 2011-11-21 (×2): 100 ug via INTRAVENOUS
  Filled 2011-11-21 (×2): qty 2

## 2011-11-21 MED ORDER — FENTANYL 2.5 MCG/ML BUPIVACAINE 1/10 % EPIDURAL INFUSION (WH - ANES)
INTRAMUSCULAR | Status: DC | PRN
  Administered 2011-11-21: 14 mL/h via EPIDURAL

## 2011-11-21 MED ORDER — PHENYLEPHRINE 40 MCG/ML (10ML) SYRINGE FOR IV PUSH (FOR BLOOD PRESSURE SUPPORT)
80.0000 ug | PREFILLED_SYRINGE | INTRAVENOUS | Status: DC | PRN
  Filled 2011-11-21: qty 5

## 2011-11-21 MED ORDER — PENICILLIN G POTASSIUM 5000000 UNITS IJ SOLR
5.0000 10*6.[IU] | Freq: Once | INTRAMUSCULAR | Status: AC
  Administered 2011-11-21: 5 10*6.[IU] via INTRAVENOUS
  Filled 2011-11-21: qty 5

## 2011-11-21 MED ORDER — MISOPROSTOL 200 MCG PO TABS
ORAL_TABLET | ORAL | Status: AC
  Administered 2011-11-21: 800 ug
  Filled 2011-11-21: qty 5

## 2011-11-21 MED ORDER — LIDOCAINE HCL (PF) 1 % IJ SOLN
INTRAMUSCULAR | Status: DC | PRN
  Administered 2011-11-21 (×2): 9 mL

## 2011-11-21 MED ORDER — EPHEDRINE 5 MG/ML INJ: 10.0000 mg | INTRAVENOUS | Status: DC | PRN

## 2011-11-21 NOTE — Progress Notes (Signed)
I examined pt and agree with documentation above and nurse midwife student plan of care. MUHAMMAD,WALIDAH  

## 2011-11-21 NOTE — Progress Notes (Signed)
I examined pt and agree with documentation above and nurse midwife student plan of care. MUHAMMAD,Timber Lucarelli  

## 2011-11-21 NOTE — Progress Notes (Signed)
I examined pt and agree with documentation above and nurse midwife student plan of care. April Garrett,[name]  

## 2011-11-21 NOTE — Anesthesia Preprocedure Evaluation (Signed)

## 2011-11-21 NOTE — Progress Notes (Signed)
TAREKA JHAVERI is a 21 y.o. G2P0100 at [redacted]w[redacted]d admitted for induction of labor due to H/o 36wk IUFD.  Subjective: Starting to feel some ctx.  Objective: BP 115/58  Pulse 83  Temp 98.3 F (36.8 C) (Oral)  Resp 18  Ht 4\' 11"  (1.499 m)  Wt 143 lb (64.864 kg)  BMI 28.88 kg/m2      FHT:  FHR: 140 bpm, variability: moderate,  accelerations:  Abscent,  decelerations:  Absent UC:   regular, every 2-4 minutes SVE:   deferred  Labs: Lab Results  Component Value Date   WBC 9.9 11/20/2011   HGB 10.6* 11/20/2011   HCT 32.8* 11/20/2011   MCV 76.5* 11/20/2011   PLT 209 11/20/2011    Assessment / Plan: IUP@39 .1 IOL for H/o 36 wk IUFD Cat II fetal heart tracing GBS pos  Cervical foley bulb in place, Pitocin started, PCN for GBS, anticipate SVD.  [name] 11/21/2011, 10:18 AM

## 2011-11-21 NOTE — Progress Notes (Signed)
April Garrett is a 21 y.o. G2P0100 at [redacted]w[redacted]d admitted for IOL for H/o 36 wk IUFD.  Subjective: Feeling a lot of back pain, breathing through ctx, declines epidural.  Objective: BP 133/82  Pulse 102  Temp 97.6 F (36.4 C) (Oral)  Resp 18  Ht 4\' 11"  (1.499 m)  Wt 143 lb (64.864 kg)  BMI 28.88 kg/m2      FHT:  FHR: 150 bpm, variability: moderate,  accelerations:  Abscent,  decelerations:  Present variable UC:   regular, every 2-3 minutes SVE:   Dilation: 7 Effacement (%): 80 Station: 0 Exam by:: April Garrett, CNM student  Labs: Lab Results  Component Value Date   WBC 9.9 11/20/2011   HGB 10.6* 11/20/2011   HCT 32.8* 11/20/2011   MCV 76.5* 11/20/2011   PLT 209 11/20/2011    Assessment / Plan: Induction of labor due to H/o IUFD,  progressing well on pitocin Presumptive posterior cephalic position, will change maternal position. Labor: Progressing normally Fetal Wellbeing:  Category II Pain Control:  Fentanyl I/D:  n/a Anticipated MOD:  NSVD  [name] 11/21/2011, 4:49 PM

## 2011-11-21 NOTE — Progress Notes (Signed)
RANELLE AUKER is a 21 y.o. G2P0100 at [redacted]w[redacted]d admitted for induction of labor due to h/o IUFD.  Subjective: Comfortable w/epidural. No c/o.  Objective: BP 130/77  Pulse 93  Temp 98.7 F (37.1 C) (Oral)  Resp 18  Ht 4\' 11"  (1.499 m)  Wt 143 lb (64.864 kg)  BMI 28.88 kg/m2  SpO2 98%      FHT:  FHR: 140 bpm, variability: moderate,  accelerations:  Present,  decelerations:  Absent UC:   regular, every 2-4  minutes SVE:   Dilation: 8.5 Effacement (%): 90 Station: 0 Exam by:: Shawna Orleans, CNM Student  Labs: Lab Results  Component Value Date   WBC 9.9 11/20/2011   HGB 10.6* 11/20/2011   HCT 32.8* 11/20/2011   MCV 76.5* 11/20/2011   PLT 209 11/20/2011    Assessment / Plan: IUP@[redacted]w[redacted]d  IOL for h/o IUFD Cat I fetal tracing  Progressing well, Pitocin off, anticipate SVD.  [name] 11/21/2011, 7:11 PM

## 2011-11-21 NOTE — H&P (Signed)
Attestation of Attending Supervision of Advanced Practitioner (CNM/NP): Evaluation and management procedures were performed by the Advanced Practitioner under my supervision and collaboration.  I have reviewed the Advanced Practitioner's note and chart, and I agree with the management and plan.  HARRAWAY-SMITH, Osmar Howton 8:33 AM

## 2011-11-21 NOTE — Progress Notes (Addendum)
LAENA LICK is a 21 y.o. G2P0100 at [redacted]w[redacted]d admitted for induction of labor due to h/o 36wk IUFD.  Subjective: Getting uncomfortable w/ uc's- rates 4/10  Objective: BP 107/62  Pulse 84  Temp 98.3 F (36.8 C) (Oral)  Resp 16  Ht 4\' 11"  (1.499 m)  Wt 64.864 kg (143 lb)  BMI 28.88 kg/m2      FHT:  FHR: 140 bpm, variability: moderate,  accelerations:  Present,  decelerations:  Absent UC:   irregular, every 1-4 minutes SVE:   Loose 1/70/-1, vtx Cervical foley bulb inserted and inflated w/ 60ml fluid without difficulty  Labs: Lab Results  Component Value Date   WBC 9.9 11/20/2011   HGB 10.6* 11/20/2011   HCT 32.8* 11/20/2011   MCV 76.5* 11/20/2011   PLT 209 11/20/2011    Assessment / Plan: IOL d/t h/o 36wk IUFD, cervical foley bulb now in place, will begin pitocin per protocol not to exceed 61mu/min while foley bulb in place S/p cytotec x 2  Labor: cervical ripening stage Preeclampsia:  n/a Fetal Wellbeing:  Category I Pain Control:  Labor support without medications I/D:  PCN per protocol when active labor begins Anticipated MOD:  NSVD  Marge Duncans 11/21/2011, 6:55 AM

## 2011-11-21 NOTE — Anesthesia Procedure Notes (Signed)
Epidural Patient location during procedure: OB Start time: 11/21/2011 6:13 PM End time: 11/21/2011 6:16 PM  Staffing Anesthesiologist: Sandrea Hughs Performed by: anesthesiologist   Preanesthetic Checklist Completed: patient identified, site marked, surgical consent, pre-op evaluation, timeout performed, IV checked, risks and benefits discussed and monitors and equipment checked  Epidural Patient position: sitting Prep: site prepped and draped and DuraPrep Patient monitoring: continuous pulse ox and blood pressure Approach: midline Injection technique: LOR air  Needle:  Needle type: Tuohy  Needle gauge: 17 G Needle length: 9 cm and 9 Needle insertion depth: 5 cm cm Catheter type: closed end flexible Catheter size: 19 Gauge Catheter at skin depth: 10 cm Test dose: negative and Other  Assessment Sensory level: T8 Events: blood not aspirated, injection not painful, no injection resistance, negative IV test and no paresthesia  Additional Notes Reason for block:procedure for pain

## 2011-11-22 ENCOUNTER — Encounter (HOSPITAL_COMMUNITY): Payer: Self-pay

## 2011-11-22 DIAGNOSIS — O98519 Other viral diseases complicating pregnancy, unspecified trimester: Secondary | ICD-10-CM

## 2011-11-22 DIAGNOSIS — O99892 Other specified diseases and conditions complicating childbirth: Secondary | ICD-10-CM

## 2011-11-22 DIAGNOSIS — A630 Anogenital (venereal) warts: Secondary | ICD-10-CM

## 2011-11-22 DIAGNOSIS — O9989 Other specified diseases and conditions complicating pregnancy, childbirth and the puerperium: Secondary | ICD-10-CM

## 2011-11-22 LAB — CBC
MCH: 24.1 pg — ABNORMAL LOW (ref 26.0–34.0)
Platelets: 167 10*3/uL (ref 150–400)
RBC: 3.53 MIL/uL — ABNORMAL LOW (ref 3.87–5.11)
RDW: 15.8 % — ABNORMAL HIGH (ref 11.5–15.5)
WBC: 19.3 10*3/uL — ABNORMAL HIGH (ref 4.0–10.5)

## 2011-11-22 MED ORDER — BENZOCAINE-MENTHOL 20-0.5 % EX AERO: 1.0000 "application " | INHALATION_SPRAY | CUTANEOUS | Status: DC | PRN

## 2011-11-22 MED ORDER — ONDANSETRON HCL 4 MG/2ML IJ SOLN: 4.0000 mg | INTRAMUSCULAR | Status: DC | PRN

## 2011-11-22 MED ORDER — IBUPROFEN 600 MG PO TABS
600.0000 mg | ORAL_TABLET | Freq: Four times a day (QID) | ORAL | Status: DC
  Administered 2011-11-22 – 2011-11-23 (×7): 600 mg via ORAL
  Filled 2011-11-22 (×7): qty 1

## 2011-11-22 MED ORDER — TETANUS-DIPHTH-ACELL PERTUSSIS 5-2.5-18.5 LF-MCG/0.5 IM SUSP
0.5000 mL | Freq: Once | INTRAMUSCULAR | Status: AC
  Administered 2011-11-22: 0.5 mL via INTRAMUSCULAR
  Filled 2011-11-22: qty 0.5

## 2011-11-22 MED ORDER — WITCH HAZEL-GLYCERIN EX PADS: 1.0000 "application " | MEDICATED_PAD | CUTANEOUS | Status: DC | PRN

## 2011-11-22 MED ORDER — DIPHENHYDRAMINE HCL 25 MG PO CAPS: 25.0000 mg | ORAL_CAPSULE | Freq: Four times a day (QID) | ORAL | Status: DC | PRN

## 2011-11-22 MED ORDER — METHYLERGONOVINE MALEATE 0.2 MG PO TABS
0.2000 mg | ORAL_TABLET | ORAL | Status: DC | PRN
Start: 2011-11-22 — End: 2011-11-23

## 2011-11-22 MED ORDER — PRENATAL MULTIVITAMIN CH
1.0000 | ORAL_TABLET | Freq: Every day | ORAL | Status: DC
  Administered 2011-11-22 – 2011-11-23 (×2): 1 via ORAL
  Filled 2011-11-22 (×2): qty 1

## 2011-11-22 MED ORDER — LANOLIN HYDROUS EX OINT: TOPICAL_OINTMENT | CUTANEOUS | Status: DC | PRN

## 2011-11-22 MED ORDER — ZOLPIDEM TARTRATE 5 MG PO TABS: 5.0000 mg | ORAL_TABLET | Freq: Every evening | ORAL | Status: DC | PRN

## 2011-11-22 MED ORDER — ONDANSETRON HCL 4 MG PO TABS: 4.0000 mg | ORAL_TABLET | ORAL | Status: DC | PRN

## 2011-11-22 MED ORDER — INFLUENZA VIRUS VACC SPLIT PF IM SUSP
0.5000 mL | INTRAMUSCULAR | Status: AC
  Administered 2011-11-22: 0.5 mL via INTRAMUSCULAR
  Filled 2011-11-22: qty 0.5

## 2011-11-22 MED ORDER — SENNOSIDES-DOCUSATE SODIUM 8.6-50 MG PO TABS
2.0000 | ORAL_TABLET | Freq: Every day | ORAL | Status: DC
  Administered 2011-11-22: 2 via ORAL

## 2011-11-22 MED ORDER — DIBUCAINE 1 % RE OINT: 1.0000 "application " | TOPICAL_OINTMENT | RECTAL | Status: DC | PRN

## 2011-11-22 MED ORDER — SIMETHICONE 80 MG PO CHEW: 80.0000 mg | CHEWABLE_TABLET | ORAL | Status: DC | PRN

## 2011-11-22 MED ORDER — METHYLERGONOVINE MALEATE 0.2 MG/ML IJ SOLN: 0.2000 mg | INTRAMUSCULAR | Status: DC | PRN

## 2011-11-22 MED ORDER — MEASLES, MUMPS & RUBELLA VAC ~~LOC~~ INJ
0.5000 mL | INJECTION | Freq: Once | SUBCUTANEOUS | Status: DC
  Filled 2011-11-22: qty 0.5

## 2011-11-22 MED ORDER — OXYCODONE-ACETAMINOPHEN 5-325 MG PO TABS
1.0000 | ORAL_TABLET | ORAL | Status: DC | PRN
  Administered 2011-11-23: 1 via ORAL
  Filled 2011-11-22: qty 1

## 2011-11-22 NOTE — Progress Notes (Signed)
UR chart review completed.  

## 2011-11-22 NOTE — Anesthesia Postprocedure Evaluation (Signed)
Anesthesia Post Note  Patient: April Garrett  Procedure(s) Performed: * No procedures listed *  Anesthesia type: Epidural  Patient location: Mother/Baby  Post pain: Pain level controlled  Post assessment: Post-op Vital signs reviewed  Last Vitals:  Filed Vitals:   11/22/11 0615  BP: 95/51  Pulse: 60  Temp: 36.7 C  Resp: 20    Post vital signs: Reviewed  Level of consciousness: awake  Complications: No apparent anesthesia complications

## 2011-11-22 NOTE — Progress Notes (Signed)
Sw met with pt briefly to assess "history of IUFD." Pt experienced a loss in August 2012 @ 26 weeks. Pt did not seem open to talk about her loss but told Sw that she is happy now. In the beginning of the pregnancy she had mixed emotions and some nervousness. She denies any depression at this time. FOB at the bedside and supportive. Sw encouraged pt to seek medical attention if PP depression symptoms arise. She agrees and was observed by this Sw bonding with the infant.       

## 2011-11-22 NOTE — Progress Notes (Signed)
Post Partum Day 1 Subjective: no complaints, up ad lib, voiding, tolerating PO and + flatus  Objective: Blood pressure 95/51, pulse 60, temperature 98.1 F (36.7 C), temperature source Oral, resp. rate 20, height 4\' 11"  (1.499 m), weight 64.864 kg (143 lb), SpO2 98.00%, unknown if currently breastfeeding.  Physical Exam:  General: alert, cooperative and no distress Lochia: appropriate Uterine Fundus: firm    Basename 11/22/11 0520 11/20/11 2109  HGB 8.5* 10.6*  HCT 26.5* 32.8*    Assessment/Plan: Plan for discharge tomorrow, Breastfeeding, Lactation consult and Contraception Nexplanon   LOS: 2 days   Tawnya Crook 11/22/2011, 7:30 AM

## 2011-11-22 NOTE — Anesthesia Postprocedure Evaluation (Signed)
  Anesthesia Post-op Note  Patient: April Garrett  Procedure(s) Performed: * No procedures listed *  Patient Location: Mother/Baby  Anesthesia Type: Epidural  Level of Consciousness: awake, alert  and oriented  Airway and Oxygen Therapy: Patient Spontanous Breathing  Post-op Pain: none  Post-op Assessment: Post-op Vital signs reviewed and Patient's Cardiovascular Status Stable  Post-op Vital Signs: Reviewed and stable  Complications: No apparent anesthesia complications

## 2011-11-23 LAB — TYPE AND SCREEN
ABO/RH(D): O POS
Antibody Screen: NEGATIVE
Unit division: 0

## 2011-11-23 NOTE — Discharge Summary (Signed)
Agree with above note.  April Garrett H. 11/23/2011 8:31 PM

## 2011-11-23 NOTE — Discharge Summary (Signed)
Obstetric Discharge Summary Reason for Admission: onset of labor Prenatal Procedures: ultrasound Intrapartum Procedures: spontaneous vaginal delivery Postpartum Procedures: none Complications-Operative and Postpartum: none Hemoglobin  Date Value Range Status  11/22/2011 8.5* 12.0 - 15.0 g/dL Final     DELTA CHECK NOTED     REPEATED TO VERIFY     HCT  Date Value Range Status  11/22/2011 26.5* 36.0 - 46.0 % Final    Physical Exam:  General: alert, cooperative, appears stated age and no distress Lochia: appropriate Uterine Fundus: firm   Discharge Diagnoses: Term Pregnancy-delivered  Discharge Information: Date: 11/23/2011 Activity: unrestricted Diet: routine Medications: Ibuprofen Condition: improved Instructions: refer to practice specific booklet Discharge to: home Follow-up Information    Follow up with [facility]. In 6 weeks.   Contact information:   [address] [place] [address] [phone]         Newborn Data: Live born female  Birth Weight: 7 lb 2.1 oz (3235 g) APGAR: 8, 9  Home with mother.  Tawnya Crook 11/23/2011, 6:24 AM

## 2012-10-19 ENCOUNTER — Telehealth: Payer: Self-pay | Admitting: Obstetrics and Gynecology

## 2012-10-23 NOTE — Telephone Encounter (Signed)
Pt had questions about condyloma and if her daughter was at any risk of getting anything from her. I advised the pt that she had been treated for the condyloma but that her daughter shouldn't be at any risk of getting anything from her delivering vaginally. Pt verbalized understanding. I also advised the pt that if she was worried about any STDs she could come to our office and be tested.

## 2012-12-21 ENCOUNTER — Other Ambulatory Visit: Payer: Self-pay

## 2013-01-02 ENCOUNTER — Other Ambulatory Visit (HOSPITAL_COMMUNITY)
Admission: RE | Admit: 2013-01-02 | Discharge: 2013-01-02 | Disposition: A | Discharge status: Home / Self Care | Payer: BC Managed Care – PPO | Source: Ambulatory Visit | Attending: Unknown Physician Specialty | Admitting: Unknown Physician Specialty

## 2013-01-02 DIAGNOSIS — R87610 Atypical squamous cells of undetermined significance on cytologic smear of cervix (ASC-US): Secondary | ICD-10-CM | POA: Insufficient documentation

## 2013-01-02 DIAGNOSIS — R87611 Atypical squamous cells cannot exclude high grade squamous intraepithelial lesion on cytologic smear of cervix (ASC-H): Secondary | ICD-10-CM | POA: Insufficient documentation

## 2013-01-30 ENCOUNTER — Encounter (HOSPITAL_COMMUNITY): Payer: Self-pay | Admitting: Emergency Medicine

## 2013-01-30 ENCOUNTER — Emergency Department (HOSPITAL_COMMUNITY)
Admission: EM | Admit: 2013-01-30 | Discharge: 2013-01-30 | Disposition: A | Discharge status: Home / Self Care | Payer: BC Managed Care – PPO | Attending: Emergency Medicine | Admitting: Emergency Medicine

## 2013-01-30 DIAGNOSIS — H6692 Otitis media, unspecified, left ear: Secondary | ICD-10-CM

## 2013-01-30 DIAGNOSIS — J069 Acute upper respiratory infection, unspecified: Secondary | ICD-10-CM | POA: Insufficient documentation

## 2013-01-30 DIAGNOSIS — R599 Enlarged lymph nodes, unspecified: Secondary | ICD-10-CM | POA: Insufficient documentation

## 2013-01-30 DIAGNOSIS — IMO0001 Reserved for inherently not codable concepts without codable children: Secondary | ICD-10-CM | POA: Insufficient documentation

## 2013-01-30 DIAGNOSIS — Z79899 Other long term (current) drug therapy: Secondary | ICD-10-CM | POA: Insufficient documentation

## 2013-01-30 DIAGNOSIS — R51 Headache: Secondary | ICD-10-CM | POA: Insufficient documentation

## 2013-01-30 DIAGNOSIS — H6690 Otitis media, unspecified, unspecified ear: Secondary | ICD-10-CM | POA: Insufficient documentation

## 2013-01-30 MED ORDER — HYDROCODONE-ACETAMINOPHEN 5-325 MG PO TABS
1.0000 | ORAL_TABLET | Freq: Once | ORAL | Status: AC
  Administered 2013-01-30: 1 via ORAL
  Filled 2013-01-30: qty 1

## 2013-01-30 MED ORDER — HYDROCODONE-ACETAMINOPHEN 5-325 MG PO TABS: 1.0000 | ORAL_TABLET | Freq: Four times a day (QID) | ORAL | Status: DC | PRN

## 2013-01-30 MED ORDER — AZITHROMYCIN 250 MG PO TABS: 250.0000 mg | ORAL_TABLET | Freq: Every day | ORAL | Status: DC

## 2013-01-30 MED ORDER — AZITHROMYCIN 250 MG PO TABS
500.0000 mg | ORAL_TABLET | Freq: Once | ORAL | Status: AC
  Administered 2013-01-30: 500 mg via ORAL
  Filled 2013-01-30: qty 2

## 2013-01-30 NOTE — ED Provider Notes (Signed)
CSN: [id number]     Arrival date & time 01/30/13  0510 History   First MD Initiated Contact with Patient 01/30/13 [phone]     Chief Complaint  Patient presents with  . Otalgia   (Consider location/radiation/quality/duration/timing/severity/associated sxs/prior Treatment) Patient is a 22 y.o. female presenting with ear pain. The history is provided by the patient.  Otalgia Location:  Left Associated symptoms: congestion, cough, fever, headaches and sore throat   Associated symptoms: no abdominal pain, no rash and no vomiting    patient with 3 day history of left ear pain. Got worse in the last day. Throbbing in nature 10 out of 10 associated with upper respiratory infection with fever cough congestion and sore throat bodyaches. Fevers or subjective. Patient had history of ear infections as a child but has had none recently. Upper respiratory infection started first then the ear pain.  Past Medical History  Diagnosis Date  . No pertinent past medical history    Past Surgical History  Procedure Laterality Date  . No past surgeries     Family History  Problem Relation Age of Onset  . Other Neg Hx    History  Substance Use Topics  . Smoking status: Never Smoker   . Smokeless tobacco: Not on file  . Alcohol Use: No   OB History   Grav Para Term Preterm Abortions TAB SAB Ect Mult Living   2 2 1 1      1      Review of Systems  Constitutional: Positive for fever.  HENT: Positive for congestion, ear pain and sore throat.   Eyes: Negative for redness.  Respiratory: Positive for cough. Negative for shortness of breath.   Cardiovascular: Negative for chest pain.  Gastrointestinal: Negative for nausea, vomiting and abdominal pain.  Genitourinary: Negative for dysuria.  Musculoskeletal: Positive for myalgias. Negative for back pain.  Skin: Negative for rash.  Neurological: Positive for headaches.  Hematological: Does not bruise/bleed easily.  Psychiatric/Behavioral: Negative for  confusion.    Allergies  Review of patient's allergies indicates no known allergies.  Home Medications   Current Outpatient Rx  Name  Route  Sig  Dispense  Refill  . azithromycin (ZITHROMAX) 250 MG tablet   Oral   Take 1 tablet (250 mg total) by mouth daily. Take first 2 tablets together, then 1 every day until finished.   6 tablet   0   . HYDROcodone-acetaminophen (NORCO/VICODIN) 5-325 MG per tablet   Oral   Take 1-2 tablets by mouth every 6 (six) hours as needed for moderate pain.   14 tablet   0   . Prenatal Vit-Fe Fumarate-FA (PRENATAL MULTIVITAMIN) TABS   Oral   Take 1 tablet by mouth daily.          BP 123/77  Pulse 81  Temp(Src) 97.7 F (36.5 C) (Oral)  Ht 4\' 11"  (1.499 m)  Wt 110 lb (49.896 kg)  BMI 22.21 kg/m2  SpO2 96%  LMP 01/30/2013 Physical Exam  Nursing note and vitals reviewed. Constitutional: She is oriented to person, place, and time. She appears well-developed and well-nourished. No distress.  HENT:  Head: Normocephalic.  Right Ear: External ear normal.  Mouth/Throat: Oropharynx is clear and moist. No oropharyngeal exudate.  Left TM with significant erythema and bulging. No signs of perforation or fluid drainage. External canals normal.  Eyes: Conjunctivae and EOM are normal. Pupils are equal, round, and reactive to light.  Neck: Normal range of motion. Neck supple.  Cardiovascular: Normal rate, regular  rhythm and normal heart sounds.   No murmur heard. Pulmonary/Chest: Effort normal and breath sounds normal. No respiratory distress. She has no wheezes. She has no rales.  Abdominal: Soft. Bowel sounds are normal. There is no tenderness.  Musculoskeletal: Normal range of motion.  Lymphadenopathy:    She has cervical adenopathy.  Neurological: She is oriented to person, place, and time. No cranial nerve deficit. She exhibits normal muscle tone. Coordination normal.  Skin: Skin is warm. No rash noted.    ED Course  Procedures (including  critical care time) Labs Review Labs Reviewed - No data to display Imaging Review No results found.  EKG Interpretation   None       MDM   1. Otitis media, left   2. URI (upper respiratory infection)    Patient clearly was significant left otitis media. Bulging and erythema no signs of perforation or fluid drainage. Also associated with upper respiratory infection. Will treat with a Zithromax and pain medication. No complicating factors.    Shelda Jakes, MD 01/30/13 [phone]

## 2013-01-30 NOTE — ED Notes (Signed)
Pt reporting pain in left ear. States that she has had some ache for past two days, but tonight pain became sharp.

## 2013-01-30 NOTE — ED Notes (Signed)
Pt has had left ear pain x2 day, woke up tonight, much worse, feels like it burst, state she can not hear out of that ear now. Pain radiating into neck and upward into head. Pt has had cough, cold and fever. Called health dept for appointment, but could not be seen until Jan.

## 2013-12-17 ENCOUNTER — Encounter (HOSPITAL_COMMUNITY): Payer: Self-pay | Admitting: Emergency Medicine

## 2014-05-20 ENCOUNTER — Ambulatory Visit: Payer: Self-pay | Admitting: Obstetrics and Gynecology

## 2014-05-21 ENCOUNTER — Ambulatory Visit: Payer: Self-pay | Admitting: Obstetrics and Gynecology

## 2014-06-13 ENCOUNTER — Other Ambulatory Visit: Payer: Self-pay | Admitting: Obstetrics and Gynecology

## 2014-09-12 ENCOUNTER — Other Ambulatory Visit (HOSPITAL_COMMUNITY): Payer: Self-pay | Admitting: Nurse Practitioner

## 2014-09-12 DIAGNOSIS — R102 Pelvic and perineal pain: Secondary | ICD-10-CM

## 2014-09-17 ENCOUNTER — Inpatient Hospital Stay (HOSPITAL_COMMUNITY): Admission: RE | Admit: 2014-09-17 | Payer: Medicaid Other | Source: Ambulatory Visit

## 2014-09-17 ENCOUNTER — Ambulatory Visit (HOSPITAL_COMMUNITY)
Admission: RE | Admit: 2014-09-17 | Discharge: 2014-09-17 | Disposition: A | Discharge status: Home / Self Care | Payer: BLUE CROSS/BLUE SHIELD | Source: Ambulatory Visit | Attending: Nurse Practitioner | Admitting: Nurse Practitioner

## 2014-09-17 DIAGNOSIS — N854 Malposition of uterus: Secondary | ICD-10-CM | POA: Diagnosis not present

## 2014-09-17 DIAGNOSIS — N830 Follicular cyst of ovary: Secondary | ICD-10-CM | POA: Diagnosis not present

## 2014-09-17 DIAGNOSIS — R102 Pelvic and perineal pain: Secondary | ICD-10-CM | POA: Insufficient documentation

## 2014-11-27 ENCOUNTER — Ambulatory Visit (INDEPENDENT_AMBULATORY_CARE_PROVIDER_SITE_OTHER): Payer: BLUE CROSS/BLUE SHIELD | Admitting: *Deleted

## 2014-11-27 DIAGNOSIS — Z23 Encounter for immunization: Secondary | ICD-10-CM | POA: Diagnosis not present

## 2015-11-19 ENCOUNTER — Ambulatory Visit (INDEPENDENT_AMBULATORY_CARE_PROVIDER_SITE_OTHER): Payer: BLUE CROSS/BLUE SHIELD

## 2015-11-19 DIAGNOSIS — Z23 Encounter for immunization: Secondary | ICD-10-CM

## 2016-09-17 IMAGING — US US TRANSVAGINAL NON-OB
1 series · 14 of 25 positions shown · non-contrast
Comparison: None

CLINICAL DATA: Pelvic and right adnexal pain for the past 2-3
months, right lower quadrant swelling for 4 5 months. Last menstrual
period was in Monday June, 2014. The patient is on Depo shots.

EXAM:
TRANSABDOMINAL AND TRANSVAGINAL ULTRASOUND OF PELVIS
TECHNIQUE: Both transabdominal and transvaginal ultrasound examinations of the
pelvis were performed. Transabdominal technique was performed for
global imaging of the pelvis including uterus, ovaries, adnexal
regions, and pelvic cul-de-sac. It was necessary to proceed with
endovaginal exam following the transabdominal exam to visualize the
endometrium and adnexal structures..

[Series 1: us transvaginal non-ob · 0.22mm/px · 14 of 58 slices shown]
[im 1/58]
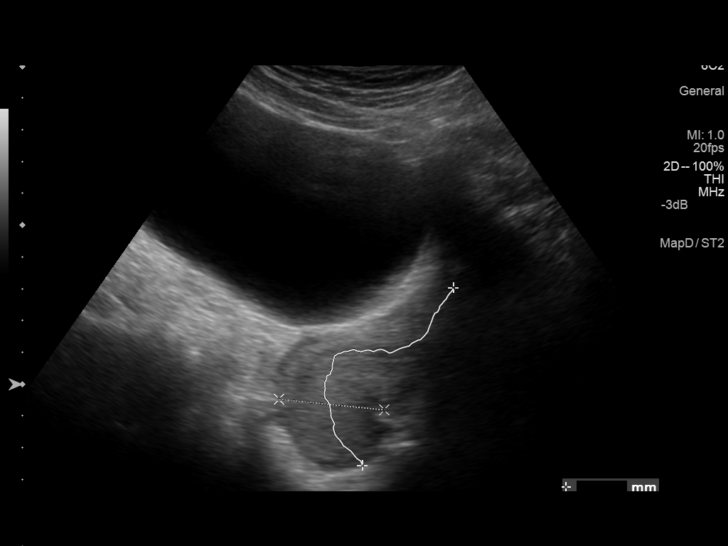
[im 5/58]
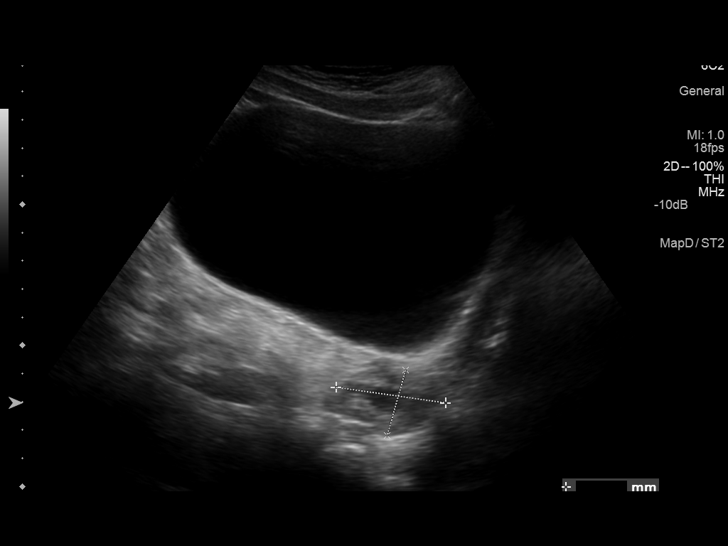
[im 10/58]
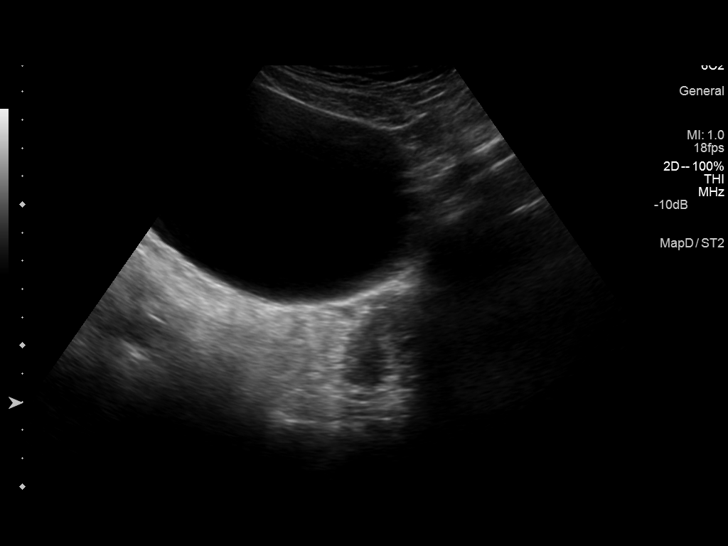
[im 15/58]
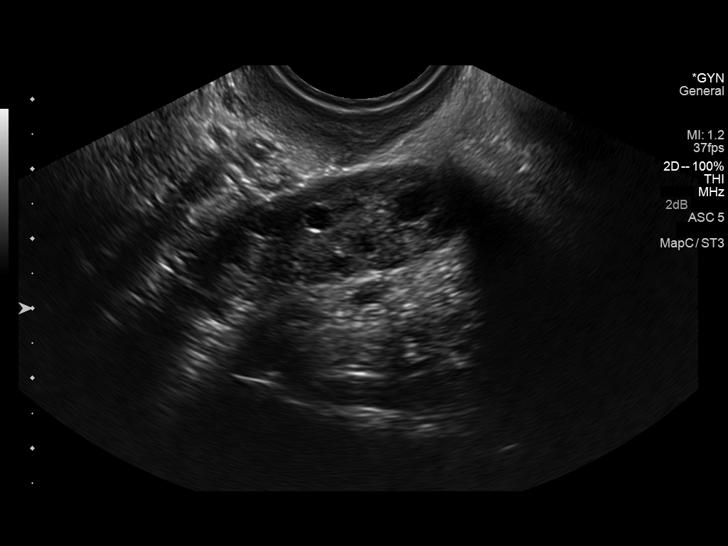
[im 20/58]
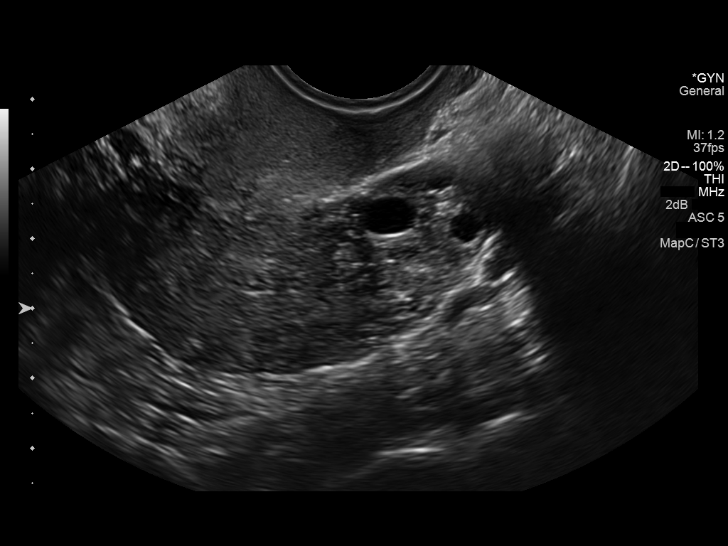
[im 22/58]
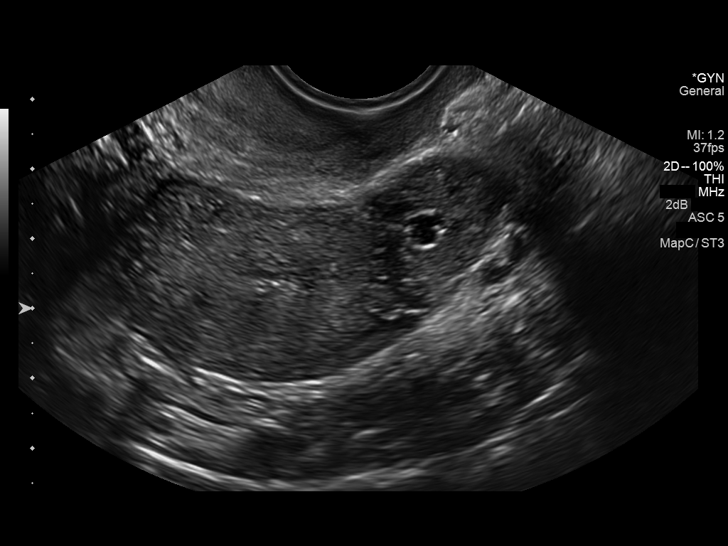
[im 27/58]
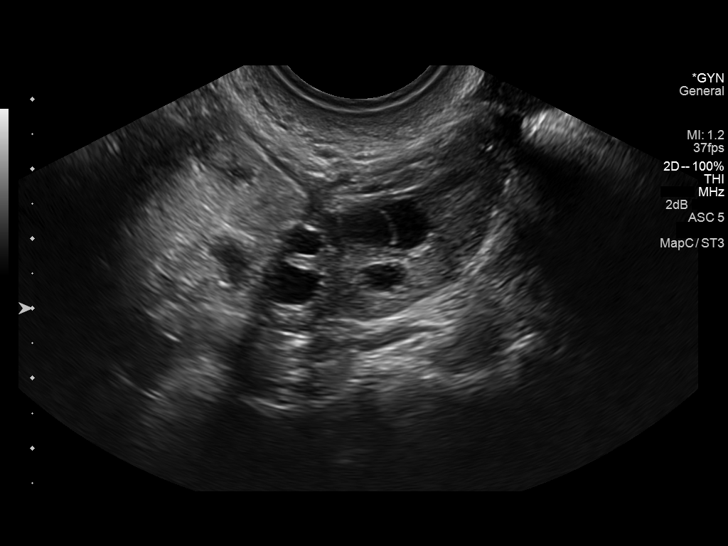
[im 31/58]
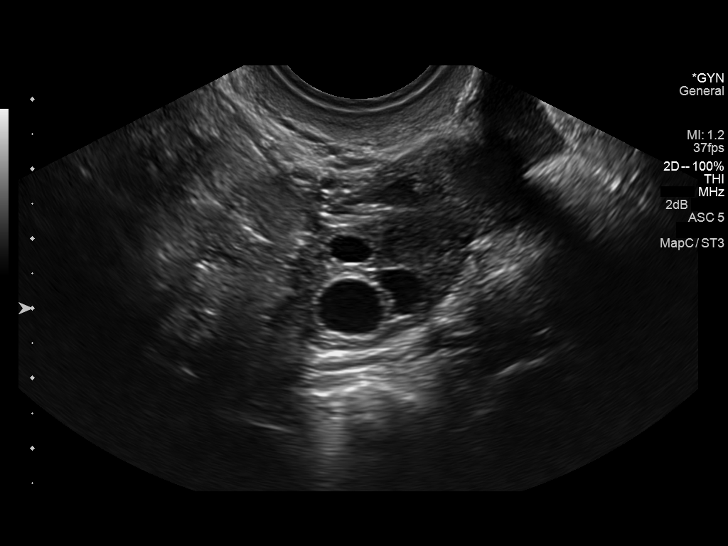
[im 36/58]
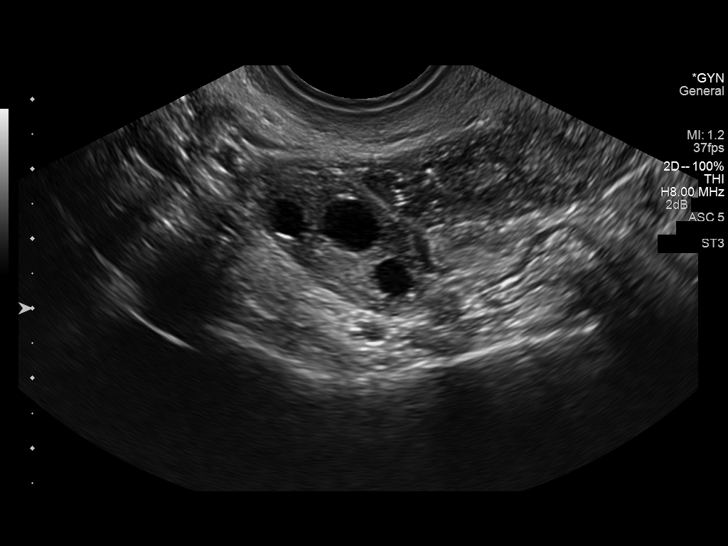
[im 39/58]
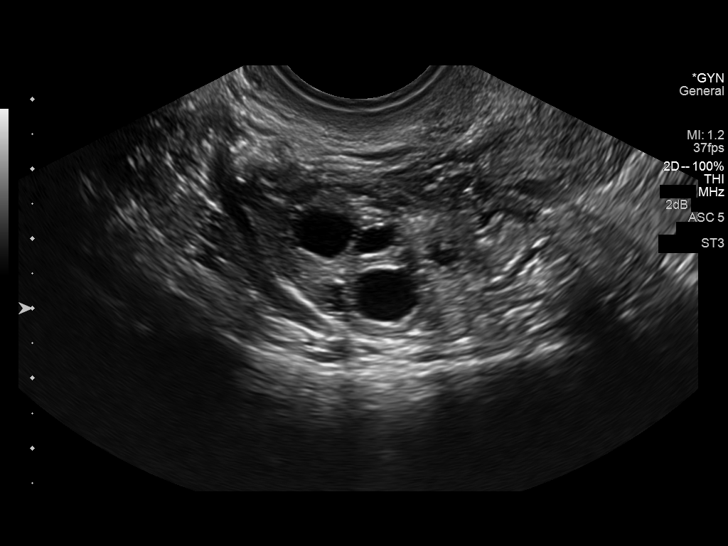
[im 43/58]
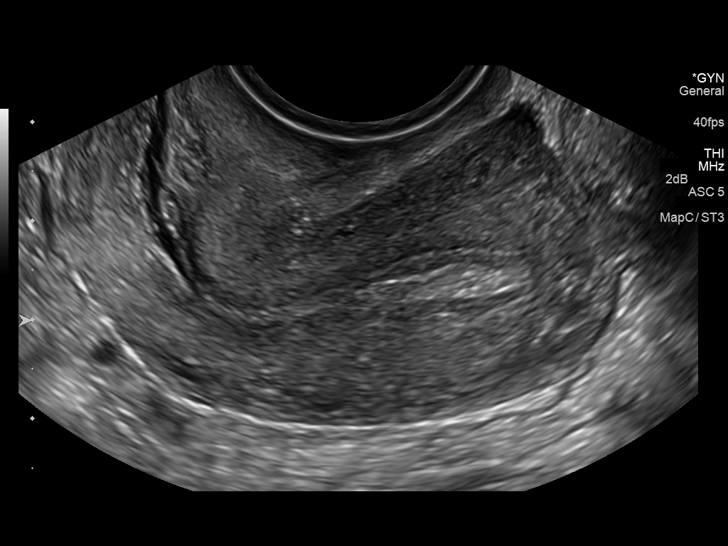
[im 48/58]
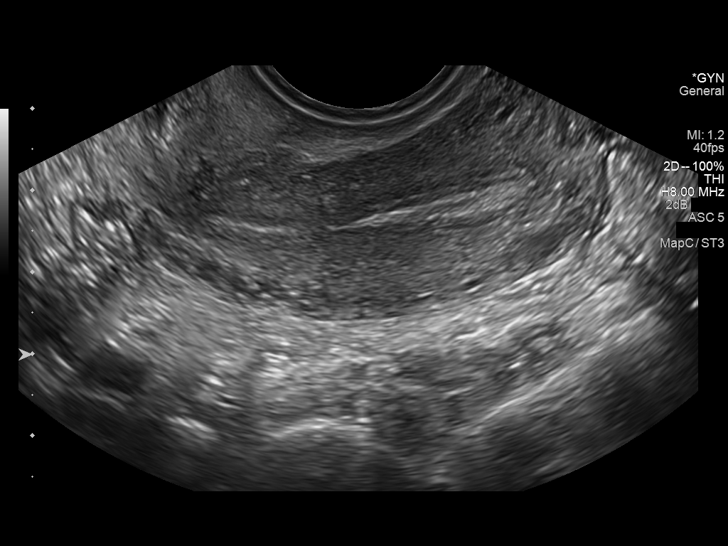
[im 53/58]
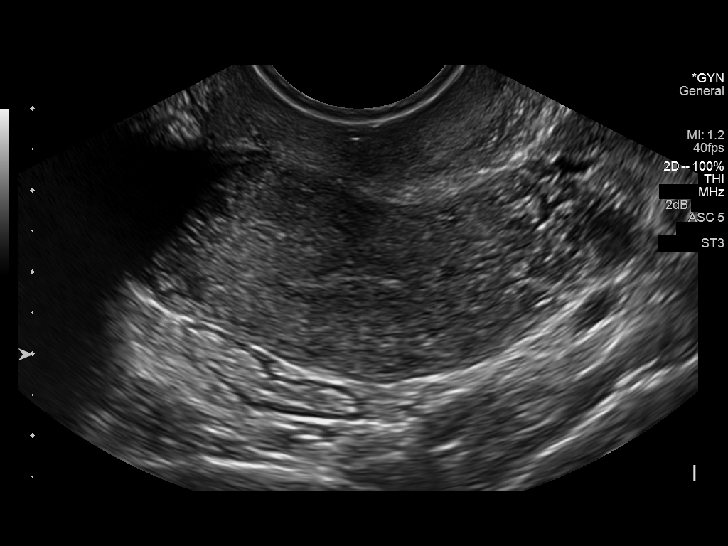
[im 58/58]
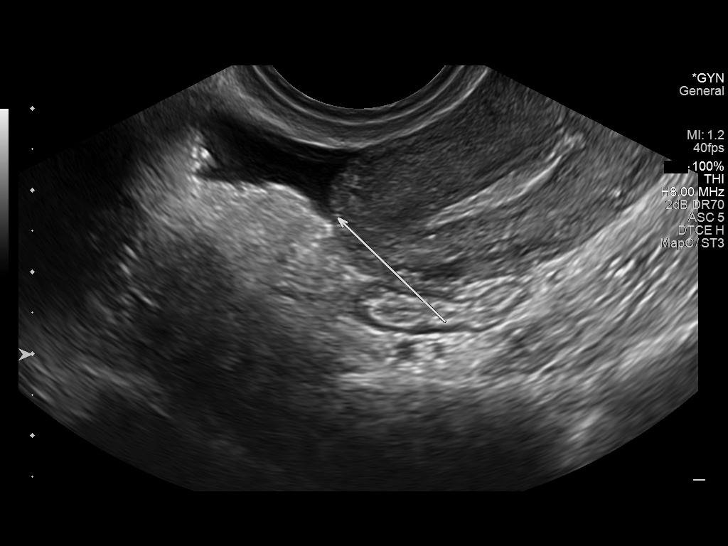

[14 of 25 positions shown; findings below may reference images not displayed]

FINDINGS: Uterus

Measurements: 8.9 x 3.3 x 5.1 cm. The uterus is retroverted. The
echotexture is normal.

Endometrium

Thickness: 8 mm.  No focal abnormality visualized.

Right ovary

Measurements: 4.3 x 1.9 x 2.1 cm. Normal appearance/no adnexal mass.

Left ovary

Measurements: 3.7 x 1.4 x 2.2 cm. Normal appearance/no adnexal mass.

Other findings

There is a small amount of free fluid in the cul de sac.
IMPRESSION: Normal pelvic ultrasound examination.  The uterus is retroverted.

## 2016-12-01 DIAGNOSIS — Z23 Encounter for immunization: Secondary | ICD-10-CM | POA: Diagnosis not present

## 2019-02-05 DIAGNOSIS — R55 Syncope and collapse: Secondary | ICD-10-CM | POA: Insufficient documentation

## 2019-05-22 DIAGNOSIS — Z8759 Personal history of other complications of pregnancy, childbirth and the puerperium: Secondary | ICD-10-CM | POA: Insufficient documentation

## 2019-07-17 DIAGNOSIS — Z9851 Tubal ligation status: Secondary | ICD-10-CM | POA: Insufficient documentation

## 2021-06-12 DIAGNOSIS — G43E09 Chronic migraine with aura, not intractable, without status migrainosus: Secondary | ICD-10-CM | POA: Insufficient documentation

## 2021-10-17 DIAGNOSIS — F419 Anxiety disorder, unspecified: Secondary | ICD-10-CM | POA: Insufficient documentation

## 2021-11-20 ENCOUNTER — Emergency Department (HOSPITAL_BASED_OUTPATIENT_CLINIC_OR_DEPARTMENT_OTHER): Payer: Medicaid Other | Admitting: Radiology

## 2021-11-20 ENCOUNTER — Other Ambulatory Visit: Payer: Self-pay

## 2021-11-20 ENCOUNTER — Encounter (HOSPITAL_BASED_OUTPATIENT_CLINIC_OR_DEPARTMENT_OTHER): Payer: Self-pay | Admitting: Emergency Medicine

## 2021-11-20 ENCOUNTER — Emergency Department (HOSPITAL_BASED_OUTPATIENT_CLINIC_OR_DEPARTMENT_OTHER)
Admission: EM | Admit: 2021-11-20 | Discharge: 2021-11-20 | Disposition: A | Discharge status: Home / Self Care | Payer: Medicaid Other | Attending: Emergency Medicine | Admitting: Emergency Medicine

## 2021-11-20 DIAGNOSIS — K920 Hematemesis: Secondary | ICD-10-CM | POA: Insufficient documentation

## 2021-11-20 DIAGNOSIS — R1110 Vomiting, unspecified: Secondary | ICD-10-CM | POA: Diagnosis present

## 2021-11-20 HISTORY — DX: Type 2 diabetes mellitus without complications: E11.9

## 2021-11-20 HISTORY — DX: Unspecified pre-eclampsia, unspecified trimester: O14.90

## 2021-11-20 HISTORY — DX: Anxiety disorder, unspecified: F41.9

## 2021-11-20 LAB — COMPREHENSIVE METABOLIC PANEL
ALT: 10 U/L (ref 0–44)
AST: 18 U/L (ref 15–41)
Albumin: 4.5 g/dL (ref 3.5–5.0)
Alkaline Phosphatase: 65 U/L (ref 38–126)
Anion gap: 11 (ref 5–15)
BUN: 12 mg/dL (ref 6–20)
CO2: 24 mmol/L (ref 22–32)
Calcium: 9.3 mg/dL (ref 8.9–10.3)
Chloride: 103 mmol/L (ref 98–111)
Creatinine, Ser: 0.83 mg/dL (ref 0.44–1.00)
GFR, Estimated: >60 mL/min (ref >60)
Glucose, Bld: 87 mg/dL (ref 70–99)
Potassium: 3.8 mmol/L (ref 3.5–5.1)
Sodium: 138 mmol/L (ref 135–145)
Total Bilirubin: 0.4 mg/dL (ref 0.3–1.2)
Total Protein: 7.7 g/dL (ref 6.5–8.1)

## 2021-11-20 LAB — URINALYSIS, ROUTINE W REFLEX MICROSCOPIC
Bilirubin Urine: NEGATIVE
Glucose, UA: NEGATIVE mg/dL
Hgb urine dipstick: NEGATIVE
Ketones, ur: NEGATIVE mg/dL
Nitrite: NEGATIVE
Specific Gravity, Urine: 1.031 — ABNORMAL HIGH (ref 1.005–1.030)
pH: 6.5 (ref 5.0–8.0)

## 2021-11-20 LAB — CBC
HCT: 40.3 % (ref 36.0–46.0)
Hemoglobin: 13.1 g/dL (ref 12.0–15.0)
MCH: 27.9 pg (ref 26.0–34.0)
MCHC: 32.5 g/dL (ref 30.0–36.0)
MCV: 85.7 fL (ref 80.0–100.0)
Platelets: 264 10*3/uL (ref 150–400)
RBC: 4.7 MIL/uL (ref 3.87–5.11)
RDW: 14.3 % (ref 11.5–15.5)
WBC: 10.4 10*3/uL (ref 4.0–10.5)
nRBC: 0 % (ref 0.0–0.2)

## 2021-11-20 LAB — HCG, SERUM, QUALITATIVE: Preg, Serum: NEGATIVE

## 2021-11-20 LAB — LIPASE, BLOOD: Lipase: 52 U/L — ABNORMAL HIGH (ref 11–51)

## 2021-11-20 MED ORDER — ONDANSETRON HCL 4 MG PO TABS: 4.0000 mg | ORAL_TABLET | Freq: Four times a day (QID) | ORAL | 0 refills | Status: DC | PRN

## 2021-11-20 MED ORDER — LACTATED RINGERS IV BOLUS
1000.0000 mL | Freq: Once | INTRAVENOUS | Status: AC
  Administered 2021-11-20: 1000 mL via INTRAVENOUS

## 2021-11-20 MED ORDER — ONDANSETRON HCL 4 MG/2ML IJ SOLN
4.0000 mg | Freq: Once | INTRAMUSCULAR | Status: AC
  Administered 2021-11-20: 4 mg via INTRAVENOUS
  Filled 2021-11-20: qty 2

## 2021-11-20 NOTE — ED Notes (Signed)
Pt was given ginger-ale to drink and crackers to eat. Pt tolerated both without any complaints or nausea.

## 2021-11-20 NOTE — ED Provider Notes (Signed)
[facility] DEPT Provider Note   CSN: [id number] Arrival date & time: 11/20/21  1354     History Chief Complaint  Patient presents with   Hematemesis    HPI April Garrett is a 31 y.o. female presenting for hematemesis.  Patient has a history of similar.  She had retching after she had breakfast this morning and then had a large vomitus episode accompanied with bright red blood.  This is approximately 4 hours prior to arrival.  She denies fevers or chills, nausea or vomiting, syncope or shortness of breath otherwise. Patient states she has now returned to her baseline is having no symptoms.  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  Has had no recurrence in the past 3 years..   Patient's recorded medical, surgical, social, medication list and allergies were reviewed in the Snapshot window as part of the initial history.   Review of Systems   Review of Systems  Constitutional:  Negative for chills and fever.  HENT:  Negative for ear pain and sore throat.   Eyes:  Negative for pain and visual disturbance.  Respiratory:  Negative for cough and shortness of breath.   Cardiovascular:  Negative for chest pain and palpitations.  Gastrointestinal:  Positive for nausea and vomiting. Negative for abdominal pain.  Genitourinary:  Negative for dysuria and hematuria.  Musculoskeletal:  Negative for arthralgias and back pain.  Skin:  Negative for color change and rash.  Neurological:  Negative for seizures and syncope.  All other systems reviewed and are negative.   Physical Exam Updated Vital Signs BP 128/87   Pulse 86   Temp 98.2 F (36.8 C) (Oral)   Resp 14   Ht 4\' 11"  (1.499 m)   Wt 74.8 kg   SpO2 99%   BMI 33.33 kg/m  Physical Exam Vitals and nursing note reviewed.  Constitutional:      General: She is not in acute distress.    Appearance: She is well-developed.  HENT:     Head: Normocephalic and atraumatic.  Eyes:      Conjunctiva/sclera: Conjunctivae normal.  Cardiovascular:     Rate and Rhythm: Normal rate and regular rhythm.     Heart sounds: No murmur heard. Pulmonary:     Effort: Pulmonary effort is normal. No respiratory distress.     Breath sounds: Normal breath sounds.  Abdominal:     General: There is no distension.     Palpations: Abdomen is soft.     Tenderness: There is no abdominal tenderness. There is no right CVA tenderness or left CVA tenderness.  Musculoskeletal:        General: No swelling or tenderness. Normal range of motion.     Cervical back: Neck supple.  Skin:    General: Skin is warm and dry.  Neurological:     General: No focal deficit present.     Mental Status: She is alert and oriented to person, place, and time. Mental status is at baseline.     Cranial Nerves: No cranial nerve deficit.      ED Course/ Medical Decision Making/ A&P Clinical Course as of 11/20/21 1939  Fri Nov 20, 2021  1720 Reassess after IVF [CC]    Clinical Course User Index [CC] Tretha Sciara, MD    Procedures Procedures   Medications Ordered in ED Medications  lactated ringers bolus 1,000 mL ( Intravenous Stopped 11/20/21 1735)  ondansetron (ZOFRAN) injection 4 mg (4 mg Intravenous Given  11/20/21 1615)    Medical Decision Making:    April Garrett is a 31 y.o. female who presented to the ED today with hematemesis detailed above.     Patient's presentation is complicated by their history of similar.  Patient placed on continuous vitals and telemetry monitoring while in ED which was reviewed periodically.   Complete initial physical exam performed, notably the patient  was HDS in NAD.      Reviewed and confirmed nursing documentation for past medical history, family history, social history.    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 Other diagnoses were considered  including (but not limited to) peptic ulcer disease, esophageal varices, Boerhaave's syndrome. These are considered less likely due to history of present illness and physical exam findings.   This is most consistent with an acute life/limb threatening illness complicated by underlying chronic conditions.  Initial Plan:  Screening labs including CBC and Metabolic panel to evaluate for infectious or metabolic etiology of disease.  Urinalysis with reflex culture ordered to evaluate for UTI or relevant urologic/nephrologic pathology.  CXR to evaluate for structural/infectious intrathoracic pathology.  EKG to evaluate for cardiac pathology. Objective evaluation as below reviewed with plan for close reassessment  Initial Study Results:   Laboratory  All laboratory results reviewed without evidence of clinically relevant pathology.    Radiology  All images reviewed independently. Agree with radiology report at this time.   DG Chest 2 View  Result Date: 11/20/2021 CLINICAL DATA:  Vomiting and hematemesis EXAM: CHEST - 2 VIEW COMPARISON:  None Available. FINDINGS: The heart size and mediastinal contours are within normal limits. Both lungs are clear. The visualized skeletal structures are unremarkable. IMPRESSION: No acute cardiopulmonary abnormality. Electronically Signed   By: Jacob Moores M.D.   On: 11/20/2021 16:30       Final Assessment and Plan:   Observed patient in 6 hours in the emergency department.  She was treated with IV fluids, p.o. Zofran with complete symptomatic resolution she is now ambulatory tolerating p.o. intake.  No further episodes of hematemesis. Shared medical decision making with patient regarding next steps in management.  Single episode of hematemesis most consistent with Mallory-Weiss tears especially given no return of symptoms.  May be underlying pathology that caused her symptoms, however since she is return to baseline she feels comfortable with outpatient care and  management with follow-up with primary care provider.  Disposition:  Based on the above findings, I believe patient is stable for discharge.    Patient/family educated about specific return precautions for given chief complaint and symptoms.  Patient/family educated about follow-up with PCP.     Patient/family expressed understanding of return precautions and need for follow-up. Patient spoken to regarding all imaging and laboratory results and appropriate follow up for these results. All education provided in verbal form with additional information in written form. Time was allowed for answering of patient questions. Patient discharged.    Emergency Department Medication Summary:   Medications  lactated ringers bolus 1,000 mL ( Intravenous Stopped 11/20/21 1735)  ondansetron (ZOFRAN) injection 4 mg (4 mg Intravenous Given 11/20/21 1615)         Clinical Impression:  1. Hematemesis, unspecified whether nausea present      Discharge   Final Clinical Impression(s) / ED Diagnoses Final diagnoses:  Hematemesis, unspecified whether nausea present    Rx / DC Orders ED Discharge Orders  Ordered    ondansetron (ZOFRAN) 4 MG tablet  Every 6 hours PRN        11/20/21 1939              Glyn Ade, MD 11/20/21 1939

## 2021-11-20 NOTE — ED Triage Notes (Signed)
Pt arrives to ED via [facility] with c/o hematemesis. Pt reports she had a sudden episode of bright red vomit. This occurred while at work and was sudden. She denies abdominal pain.She reports increased fatigue and malaise.

## 2021-11-25 ENCOUNTER — Telehealth: Payer: Medicaid Other | Admitting: Family

## 2021-11-25 DIAGNOSIS — H1033 Unspecified acute conjunctivitis, bilateral: Secondary | ICD-10-CM

## 2021-11-25 MED ORDER — POLYMYXIN B-TRIMETHOPRIM 10000-0.1 UNIT/ML-% OP SOLN: 1.0000 [drp] | Freq: Four times a day (QID) | OPHTHALMIC | 0 refills | Status: DC

## 2021-11-25 NOTE — Addendum Note (Signed)
Addended by: Evelina Dun A on: 11/25/2021 05:37 PM   Modules accepted: Orders

## 2021-11-25 NOTE — Progress Notes (Signed)

## 2022-11-12 ENCOUNTER — Emergency Department (HOSPITAL_COMMUNITY)
Admission: EM | Admit: 2022-11-12 | Discharge: 2022-11-12 | Disposition: A | Discharge status: Home / Self Care | Payer: Medicaid Other | Attending: Emergency Medicine | Admitting: Emergency Medicine

## 2022-11-12 ENCOUNTER — Other Ambulatory Visit: Payer: Self-pay

## 2022-11-12 ENCOUNTER — Encounter (HOSPITAL_COMMUNITY): Payer: Self-pay | Admitting: *Deleted

## 2022-11-12 DIAGNOSIS — Z20822 Contact with and (suspected) exposure to covid-19: Secondary | ICD-10-CM | POA: Insufficient documentation

## 2022-11-12 DIAGNOSIS — R519 Headache, unspecified: Secondary | ICD-10-CM | POA: Diagnosis present

## 2022-11-12 DIAGNOSIS — R799 Abnormal finding of blood chemistry, unspecified: Secondary | ICD-10-CM | POA: Diagnosis not present

## 2022-11-12 DIAGNOSIS — G43009 Migraine without aura, not intractable, without status migrainosus: Secondary | ICD-10-CM | POA: Insufficient documentation

## 2022-11-12 LAB — CBC
HCT: 43.1 % (ref 36.0–46.0)
Hemoglobin: 13.8 g/dL (ref 12.0–15.0)
MCH: 28.2 pg (ref 26.0–34.0)
MCHC: 32 g/dL (ref 30.0–36.0)
MCV: 88 fL (ref 80.0–100.0)
Platelets: 320 10*3/uL (ref 150–400)
RBC: 4.9 MIL/uL (ref 3.87–5.11)
RDW: 14.2 % (ref 11.5–15.5)
WBC: 5.6 10*3/uL (ref 4.0–10.5)
nRBC: 0 % (ref 0.0–0.2)

## 2022-11-12 LAB — COMPREHENSIVE METABOLIC PANEL
ALT: 20 U/L (ref 0–44)
AST: 23 U/L (ref 15–41)
Albumin: 4.3 g/dL (ref 3.5–5.0)
Alkaline Phosphatase: 66 U/L (ref 38–126)
Anion gap: 7 (ref 5–15)
BUN: 12 mg/dL (ref 6–20)
CO2: 29 mmol/L (ref 22–32)
Calcium: 9.3 mg/dL (ref 8.9–10.3)
Chloride: 101 mmol/L (ref 98–111)
Creatinine, Ser: 0.8 mg/dL (ref 0.44–1.00)
GFR, Estimated: >60 mL/min (ref >60)
Glucose, Bld: 100 mg/dL — ABNORMAL HIGH (ref 70–99)
Potassium: 4.1 mmol/L (ref 3.5–5.1)
Sodium: 137 mmol/L (ref 135–145)
Total Bilirubin: 0.6 mg/dL (ref 0.3–1.2)
Total Protein: 7.7 g/dL (ref 6.5–8.1)

## 2022-11-12 LAB — URINALYSIS, ROUTINE W REFLEX MICROSCOPIC
Bilirubin Urine: NEGATIVE
Glucose, UA: NEGATIVE mg/dL
Hgb urine dipstick: NEGATIVE
Ketones, ur: 5 mg/dL — AB
Leukocytes,Ua: NEGATIVE
Nitrite: NEGATIVE
Protein, ur: NEGATIVE mg/dL
Specific Gravity, Urine: 1.015 (ref 1.005–1.030)
pH: 6 (ref 5.0–8.0)

## 2022-11-12 LAB — SARS CORONAVIRUS 2 BY RT PCR: SARS Coronavirus 2 by RT PCR: NEGATIVE

## 2022-11-12 LAB — CBG MONITORING, ED: Glucose-Capillary: 84 mg/dL (ref 70–99)

## 2022-11-12 LAB — LIPASE, BLOOD: Lipase: 35 U/L (ref 11–51)

## 2022-11-12 LAB — PREGNANCY, URINE: Preg Test, Ur: NEGATIVE

## 2022-11-12 MED ORDER — DIPHENHYDRAMINE HCL 50 MG/ML IJ SOLN
50.0000 mg | Freq: Once | INTRAMUSCULAR | Status: AC
  Administered 2022-11-12: 50 mg via INTRAVENOUS
  Filled 2022-11-12: qty 1

## 2022-11-12 MED ORDER — LACTATED RINGERS IV BOLUS
1000.0000 mL | Freq: Once | INTRAVENOUS | Status: AC
  Administered 2022-11-12: 1000 mL via INTRAVENOUS

## 2022-11-12 MED ORDER — PROCHLORPERAZINE EDISYLATE 10 MG/2ML IJ SOLN
10.0000 mg | Freq: Once | INTRAMUSCULAR | Status: AC
  Administered 2022-11-12: 10 mg via INTRAVENOUS
  Filled 2022-11-12: qty 2

## 2022-11-12 MED ORDER — ACETAMINOPHEN 500 MG PO TABS
1000.0000 mg | ORAL_TABLET | Freq: Once | ORAL | Status: AC
  Administered 2022-11-12: 1000 mg via ORAL
  Filled 2022-11-12: qty 2

## 2022-11-12 MED ORDER — KETOROLAC TROMETHAMINE 15 MG/ML IJ SOLN
15.0000 mg | Freq: Once | INTRAMUSCULAR | Status: AC
  Administered 2022-11-12: 15 mg via INTRAVENOUS
  Filled 2022-11-12: qty 1

## 2022-11-12 MED ORDER — ONDANSETRON 8 MG PO TBDP
8.0000 mg | ORAL_TABLET | Freq: Once | ORAL | Status: AC
  Administered 2022-11-12: 8 mg via ORAL
  Filled 2022-11-12: qty 1

## 2022-11-12 NOTE — ED Triage Notes (Signed)
Pt BIB [facility] for emesis since last night.  + HA  pt with hx of migraines, + photosensitivity + body aches

## 2022-11-12 NOTE — ED Provider Notes (Signed)
[facility] EMERGENCY DEPARTMENT AT [facility] Provider Note   CSN: [id number] Arrival date & time: 11/12/22  1522     History  Chief Complaint  Patient presents with   Emesis    April Garrett is a 31 y.o. female with history of migraine with aura presenting with migraine.  Reports her headache started last night and has persisted into today.  The headache is consistent with prior migraines.  She reports photosensitivity and nausea with no vomiting.  Denies any neck stiffness, fever or chills, trauma, sudden onset, change in migraine pattern.   Emesis Associated symptoms: headaches        Home Medications Prior to Admission medications   Medication Sig Start Date End Date Taking? Authorizing Provider  azithromycin (ZITHROMAX) 250 MG tablet Take 1 tablet (250 mg total) by mouth daily. Take first 2 tablets together, then 1 every day until finished. 01/30/13   Vanetta Mulders, MD  HYDROcodone-acetaminophen (NORCO/VICODIN) 5-325 MG per tablet Take 1-2 tablets by mouth every 6 (six) hours as needed for moderate pain. 01/30/13   Vanetta Mulders, MD  ondansetron (ZOFRAN) 4 MG tablet Take 1 tablet (4 mg total) by mouth every 6 (six) hours as needed for nausea or vomiting. 11/20/21   Glyn Ade, MD  Prenatal Vit-Fe Fumarate-FA (PRENATAL MULTIVITAMIN) TABS Take 1 tablet by mouth daily.    [provider]  trimethoprim-polymyxin b (POLYTRIM) ophthalmic solution Place 1 drop into the left eye every 6 (six) hours. 11/25/21   Junie Spencer, FNP      Allergies    Patient has no known allergies.    Review of Systems   Review of Systems  Gastrointestinal:  Positive for nausea. Negative for vomiting.  Neurological:  Positive for headaches.    Physical Exam Updated Vital Signs BP 128/87 (BP Location: Left Arm)   Pulse 60   Temp 98 F (36.7 C) (Oral)   Resp 17   Ht 4\' 11"  (1.499 m)   Wt 74.8 kg   LMP 11/05/2022   SpO2 99%   BMI 33.33 kg/m   Physical Exam Vitals and nursing note reviewed.  Constitutional:      Appearance: Normal appearance.     Comments: Overall appearing, no active emesis  HENT:     Head: Atraumatic.  Eyes:     Extraocular Movements: Extraocular movements intact.     Pupils: Pupils are equal, round, and reactive to light.  Neck:     Comments: No neck rigidity Cardiovascular:     Rate and Rhythm: Normal rate and regular rhythm.  Pulmonary:     Effort: Pulmonary effort is normal.     Breath sounds: Normal breath sounds.  Musculoskeletal:     Cervical back: Normal range of motion.  Neurological:     General: No focal deficit present.     Mental Status: She is alert.     Comments: Cranial nerves III through XII intact  Psychiatric:        Mood and Affect: Mood normal.        Behavior: Behavior normal.     ED Results / Procedures / Treatments   Labs (all labs ordered are listed, but only abnormal results are displayed) Labs Reviewed  COMPREHENSIVE METABOLIC PANEL - Abnormal; Notable for the following components:      Result Value   Glucose, Bld 100 (*)    All other components within normal limits  URINALYSIS, ROUTINE W REFLEX MICROSCOPIC - Abnormal; Notable for the following components:  Ketones, ur 5 (*)    All other components within normal limits  SARS CORONAVIRUS 2 BY RT PCR  LIPASE, BLOOD  CBC  PREGNANCY, URINE  CBG MONITORING, ED    EKG None  Radiology No results found.  Procedures Procedures    Medications Ordered in ED Medications  ondansetron (ZOFRAN-ODT) disintegrating tablet 8 mg (8 mg Oral Given 11/12/22 1930)  lactated ringers bolus 1,000 mL (0 mLs Intravenous Stopped 11/12/22 2225)  ketorolac (TORADOL) 15 MG/ML injection 15 mg (15 mg Intravenous Given 11/12/22 1951)  prochlorperazine (COMPAZINE) injection 10 mg (10 mg Intravenous Given 11/12/22 1952)  diphenhydrAMINE (BENADRYL) injection 50 mg (50 mg Intravenous Given 11/12/22 2007)  acetaminophen (TYLENOL) tablet  1,000 mg (1,000 mg Oral Given 11/12/22 2225)    ED Course/ Medical Decision Making/ A&P                                 Medical Decision Making Amount and/or Complexity of Data Reviewed Labs: ordered.  Risk OTC drugs. Prescription drug management.   32 y.o. female with pertinent past medical history of migraine with aura presents to the ED for concern of migraine since last night  Differential diagnosis includes but is not limited to tension headache, migraine, meningitis, intracranial space-occupying lesion, intracranial hemorrhage  ED Course:  Patient has a history of migraines and this is consistent with her previous migraines per patient.  Some photosensitivity and nausea.  No fever, no neck stiffness, no concern for meningitis at this time.  No history of trauma, headache pattern is consistent with prior, no indication for CT head imaging at this time.  CBC, CMP, lipase, urinalysis, all unremarkable.  COVID test was ordered by triage which is also negative. Patient was started on IV LR bolus, Benadryl, Compazine, Toradol for headache.  Was also given Zofran during triage. Patient was reevaluated after the migraine cocktail and she reports her nausea has completely resolved and the headache is almost completely gone.  Feels like she can go home at this time.  She is a bit drowsy, patient is able to have someone else drive her home. Given headache was not completely resolved, patient given Tylenol prior to discharge.  Patient appropriate for discharge at this time.  Impression: Migraine, resolved  Disposition:  The patient was discharged home with instructions to follow-up with PCP regarding migraine management.  Tylenol and ibuprofen as needed for migraine at home. Return precautions given.  Lab Tests: I Ordered, and personally interpreted labs.  The pertinent results include:   CBC, CMP, lipase, urinalysis unremarkable Urine pregnancy  negative COVID-negative            Final Clinical Impression(s) / ED Diagnoses Final diagnoses:  Migraine without aura and without status migrainosus, not intractable    Rx / DC Orders ED Discharge Orders     None         Arabella Merles, PA-C 11/12/22 2325    Vanetta Mulders, MD 11/13/22 2322

## 2022-11-12 NOTE — ED Provider Triage Note (Signed)
Emergency Medicine Provider Triage Evaluation Note  KYLEI PURINGTON , a 32 y.o. female  was evaluated in triage.  Pt complains of headache that has not resolved with her normal Cymbalta.  States she has a history of migraines with aura and usually needs a migraine cocktail for them to resolve.  Reports some nausea.  Denies any trauma, changes in vision, fever or chills, neck stiffness.  Review of Systems  Positive: As above Negative: As above  Physical Exam  BP 133/87 (BP Location: Right Arm)   Pulse 63   Temp 99.1 F (37.3 C) (Oral)   Resp 14   Ht 4\' 11"  (1.499 m)   Wt 74.8 kg   LMP 11/05/2022   SpO2 100%   BMI 33.33 kg/m  Gen:   Awake, no distress   Resp:  Normal effort  MSK:   Moves extremities without difficulty    Medical Decision Making  Medically screening exam initiated at 6:05 PM.  Appropriate orders placed.  JAQUILLA WOODROOF was informed that the remainder of the evaluation will be completed by another provider, this initial triage assessment does not replace that evaluation, and the importance of remaining in the ED until their evaluation is complete.     Arabella Merles, PA-C 11/12/22 1807

## 2022-11-12 NOTE — ED Notes (Signed)
Patient ambulated to restroom with no assistance.  

## 2022-11-12 NOTE — Discharge Instructions (Addendum)
You may use up to 800mg  ibuprofen every 8 hours as needed for pain.  Do not exceed 2.4g of ibuprofen per day.  You may take up to 1000mg  of tylenol every 6 hours as needed for pain.  Do not take more then 4g per day.  Please follow-up with your PCP regarding migraine management.  You may benefit from additional medication to help treat and prevent migraines.  Return to the ER for any uncontrolled nausea or vomiting, severe worsening of your headache, changes in vision, fever, inability to move your neck, any other new or concerning symptoms.

## 2023-01-30 ENCOUNTER — Telehealth: Payer: Medicaid Other | Admitting: Family

## 2023-01-30 DIAGNOSIS — B9689 Other specified bacterial agents as the cause of diseases classified elsewhere: Secondary | ICD-10-CM | POA: Diagnosis not present

## 2023-01-30 DIAGNOSIS — N760 Acute vaginitis: Secondary | ICD-10-CM

## 2023-01-30 MED ORDER — METRONIDAZOLE 500 MG PO TABS
500.0000 mg | ORAL_TABLET | Freq: Two times a day (BID) | ORAL | 0 refills | Status: DC
Start: 2023-01-30 — End: 2023-11-14

## 2023-01-30 NOTE — Progress Notes (Signed)
Virtual Visit Consent   [place], you are scheduled for a virtual visit with a [facility] provider today. Just as with appointments in the office, your consent must be obtained to participate. Your consent will be active for this visit and any virtual visit you may have with one of our providers in the next 365 days. If you have a MyChart account, a copy of this consent can be sent to you electronically.  As this is a virtual visit, video technology does not allow for your provider to perform a traditional examination. This may limit your provider's ability to fully assess your condition. If your provider identifies any concerns that need to be evaluated in person or the need to arrange testing (such as labs, EKG, etc.), we will make arrangements to do so. Although advances in technology are sophisticated, we cannot ensure that it will always work on either your end or our end. If the connection with a video visit is poor, the visit may have to be switched to a telephone visit. With either a video or telephone visit, we are not always able to ensure that we have a secure connection.  By engaging in this virtual visit, you consent to the provision of healthcare and authorize for your insurance to be billed (if applicable) for the services provided during this visit. Depending on your insurance coverage, you may receive a charge related to this service.  I need to obtain your verbal consent now. Are you willing to proceed with your visit today? April Garrett has provided verbal consent on 01/30/2023 for a virtual visit (video or telephone). Jannifer Rodney, FNP  Date: 01/30/2023 7:35 PM  Virtual Visit via Video Note   I, Jannifer Rodney, connected with  April Garrett  ([id number], [birth year]) on 01/30/23 at  7:30 PM EST by a video-enabled telemedicine application and verified that I am speaking with the correct person using two identifiers.  Location: Patient: Virtual Visit Location  Patient: Home Provider: Virtual Visit Location Provider: Home Office   I discussed the limitations of evaluation and management by telemedicine and the availability of in person appointments. The patient expressed understanding and agreed to proceed.    History of Present Illness: April Garrett is a 32 y.o. who identifies as a female who was assigned female at birth, and is being seen today for vaginal discharge that started three days ago.  HPI: Vaginal Discharge The patient's primary symptoms include a genital odor and vaginal discharge. The patient's pertinent negatives include no genital itching. This is a new problem. The current episode started in the past 7 days. The patient is experiencing no pain. The vaginal discharge was clear. She has tried nothing for the symptoms. The treatment provided no relief.    Problems: There are no active problems to display for this patient.   Allergies: No Known Allergies Medications:  Current Outpatient Medications:    metroNIDAZOLE (FLAGYL) 500 MG tablet, Take 1 tablet (500 mg total) by mouth 2 (two) times daily., Disp: 14 tablet, Rfl: 0   ondansetron (ZOFRAN) 4 MG tablet, Take 1 tablet (4 mg total) by mouth every 6 (six) hours as needed for nausea or vomiting., Disp: 20 tablet, Rfl: 0  Observations/Objective: Patient is well-developed, well-nourished in no acute distress.  Resting comfortably  at home.  Head is normocephalic, atraumatic.  No labored breathing.  Speech is clear and coherent with logical content.  Patient is alert and oriented at baseline.    Assessment and  Plan: 1. Bacterial vaginosis (Primary) - metroNIDAZOLE (FLAGYL) 500 MG tablet; Take 1 tablet (500 mg total) by mouth 2 (two) times daily.  Dispense: 14 tablet; Refill: 0  Safe sex  Do not douche Keep clean and dry  Follow up if symptoms worsen or do not improve   Follow Up Instructions: I discussed the assessment and treatment plan with the patient. The patient  was provided an opportunity to ask questions and all were answered. The patient agreed with the plan and demonstrated an understanding of the instructions.  A copy of instructions were sent to the patient via MyChart unless otherwise noted below.     The patient was advised to call back or seek an in-person evaluation if the symptoms worsen or if the condition fails to improve as anticipated.    Jannifer Rodney, FNP

## 2023-08-24 ENCOUNTER — Other Ambulatory Visit: Payer: Self-pay

## 2023-08-24 ENCOUNTER — Ambulatory Visit: Payer: Self-pay

## 2023-08-24 ENCOUNTER — Emergency Department (HOSPITAL_COMMUNITY)
Admission: EM | Admit: 2023-08-24 | Discharge: 2023-08-24 | Discharge status: Against Medical Advice | Payer: Self-pay | Attending: Student | Admitting: Student

## 2023-08-24 ENCOUNTER — Encounter (HOSPITAL_COMMUNITY): Payer: Self-pay

## 2023-08-24 DIAGNOSIS — Z5321 Procedure and treatment not carried out due to patient leaving prior to being seen by health care provider: Secondary | ICD-10-CM | POA: Insufficient documentation

## 2023-08-24 DIAGNOSIS — N644 Mastodynia: Secondary | ICD-10-CM | POA: Insufficient documentation

## 2023-08-24 NOTE — ED Notes (Signed)
 Called patient three times in waiting room. No response

## 2023-08-24 NOTE — Telephone Encounter (Signed)
 FYI Only or Action Required?: FYI only for provider.  Patient was last seen in primary care on 01/30/2023 by Lavell Bari LABOR, FNP as a video visit. Patient calling to be establish care.   Called Nurse Triage reporting Breast Problem.  Symptoms began yesterday.  Interventions attempted: OTC medications: Motrin  and Rest, hydration, or home remedies.  Symptoms are: unchanged.  Triage Disposition: Go to ED Now (or PCP Triage)  Patient/caregiver understands and will follow disposition?: Yes  Copied from CRM [id number]. Topic: Clinical - Red Word Triage >> Aug 24, 2023  1:07 PM Avram MATSU wrote: Red Word that prompted transfer to Nurse Triage: patient has pain going from her breast to arm also has a rash.   ----------------------------------------------------------------------- From previous Reason for Contact - Pink Word Triage: Reason for Triage:  1. SYMPTOM: What's the main symptom you're concerned about? (e.g., lump, nipple discharge, pain, rash) Pain to right breast along with rash on top of breast along with a mole on breast that is changing size   2. LOCATION: Where is the pain located? Right breast  3. ONSET: When did pain start? Started yesterday  4. PRIOR HISTORY: Do you have any history of prior problems with your breasts? (e.g., breast cancer, breast implant, fibrocystic breast disease)  no other history  5. CAUSE: What do you think is causing this symptom? unsure 6. OTHER SYMPTOMS: Do you have any other symptoms? (e.g., breast pain, fever, nipple discharge, redness or rash) patient endorses running a fever along with a rash to her breast.  7. PREGNANCY-BREASTFEEDING: Is there any chance you are pregnant? When was your last menstrual period? Are you breastfeeding? No pregnancy nor is she breast feeding Reason for Disposition  Patient sounds very sick or weak to the triager  Protocols used: Breast Symptoms-A-AH

## 2023-08-24 NOTE — ED Triage Notes (Signed)
 Reports a mole under right axilla has changed and now having pain in right breast and has noticed a rash.  No rash noted in triage but mole is large and raised but no irregular boarders.  Reports having fevers and has been taking tylenol  and reports fever since last night.  Reports fever of 103 last night.

## 2023-08-24 NOTE — ED Provider Notes (Signed)
 Patient left without being seen.  I did not see or evaluate this patient.   April Dixon, MD 08/24/23 [phone]

## 2023-11-12 DIAGNOSIS — R61 Generalized hyperhidrosis: Secondary | ICD-10-CM | POA: Insufficient documentation

## 2023-11-14 ENCOUNTER — Ambulatory Visit (INDEPENDENT_AMBULATORY_CARE_PROVIDER_SITE_OTHER): Admitting: Family Medicine

## 2023-11-14 ENCOUNTER — Encounter: Payer: Self-pay | Admitting: Family Medicine

## 2023-11-14 VITALS — BP 120/84 | HR 83 | Ht 59.0 in | Wt 185.0 lb

## 2023-11-14 DIAGNOSIS — R5382 Chronic fatigue, unspecified: Secondary | ICD-10-CM | POA: Insufficient documentation

## 2023-11-14 DIAGNOSIS — E611 Iron deficiency: Secondary | ICD-10-CM | POA: Diagnosis not present

## 2023-11-14 DIAGNOSIS — E559 Vitamin D deficiency, unspecified: Secondary | ICD-10-CM

## 2023-11-14 DIAGNOSIS — G90A Postural orthostatic tachycardia syndrome (POTS): Secondary | ICD-10-CM | POA: Insufficient documentation

## 2023-11-14 NOTE — Patient Instructions (Addendum)
 Consider medications - Beta blocker, Fludrocortisone.  Stay hydrated. Liberal salt intake.  Labs today.  Follow up in 6 months or sooner if needed.

## 2023-11-15 ENCOUNTER — Ambulatory Visit: Payer: Self-pay | Admitting: Family Medicine

## 2023-11-15 DIAGNOSIS — E559 Vitamin D deficiency, unspecified: Secondary | ICD-10-CM | POA: Insufficient documentation

## 2023-11-15 DIAGNOSIS — E611 Iron deficiency: Secondary | ICD-10-CM | POA: Insufficient documentation

## 2023-11-15 LAB — IRON,TIBC AND FERRITIN PANEL
Ferritin: 14 ng/mL — ABNORMAL LOW (ref 15–150)
Iron Saturation: 24 % (ref 15–55)
Iron: 79 ug/dL (ref 27–159)
Total Iron Binding Capacity: 332 ug/dL (ref 250–450)
UIBC: 253 ug/dL (ref 131–425)

## 2023-11-15 LAB — VITAMIN D 25 HYDROXY (VIT D DEFICIENCY, FRACTURES): Vit D, 25-Hydroxy: 23.9 ng/mL — ABNORMAL LOW (ref 30.0–100.0)

## 2023-11-15 LAB — VITAMIN B12: Vitamin B-12: 480 pg/mL (ref 232–1245)

## 2023-11-15 MED ORDER — IRON (FERROUS SULFATE) 325 (65 FE) MG PO TABS
325.0000 mg | ORAL_TABLET | ORAL | 1 refills | Status: AC
Start: 2023-11-15 — End: ?

## 2023-11-15 NOTE — Assessment & Plan Note (Signed)
 Advised to consider pharmacotherapy: Beta-blocker, fludrocortisone.  Ensure adequate hydration.  Increase salt intake.  Compression stockings.  Labs obtained as requested.

## 2023-11-15 NOTE — Progress Notes (Signed)
 Subjective:  Patient ID: April Garrett, female    DOB: 1990-07-25  Age: 33 y.o. MRN: [id number]  CC:   Chief Complaint  Patient presents with   Establish Care    HPI:  33 year old female with POTS presents to establish care.  Patient recently seen by cardiology at [facility] regarding POTS.  Patient states that she has been having significant difficulty.  Reports ongoing dizziness and syncope.  Occurs several times a month.  Patient states that she is trying to stay hydrated and increase her salt intake but this has not improved her symptoms.  She is on no pharmacotherapy regarding her POTS at this time.  Patient states that she would like blood work done to check iron, B12, and vitamin D.  She feels like that if these things were optimized that she would be able to manage her POTS better.  Advised her that these can be contributing factors but the biggest key factor in POTS is hydration, lifestyle changes, and compression as advised by cardiology.  Advised her to consider pharmacotherapy.  Patient Active Problem List   Diagnosis Date Noted   Vitamin D deficiency 11/15/2023   Iron deficiency 11/15/2023   POTS (postural orthostatic tachycardia syndrome) 11/14/2023   Chronic fatigue 11/14/2023   Chronic migraine with aura 06/12/2021   S/P tubal ligation 07/17/2019   H/O severe pre-eclampsia 05/22/2019   Syncope 02/05/2019    Social Hx   Social History   Socioeconomic History   Marital status: Single    Spouse name: Not on file   Number of children: Not on file   Years of education: Not on file   Highest education level: Not on file  Occupational History   Not on file  Tobacco Use   Smoking status: Never   Smokeless tobacco: Not on file  Vaping Use   Vaping status: Never Used  Substance and Sexual Activity   Alcohol use: No   Drug use: No   Sexual activity: Yes  Other Topics Concern   Not on file  Social History Narrative   Not on file   Social Drivers of Health    Financial Resource Strain: Not on file  Food Insecurity: Medium Risk (08/27/2023)   Received from [facility]   Hunger Vital Sign    Within the past 12 months, you worried that your food would run out before you got money to buy more: Patient declined to answer    Within the past 12 months, the food you bought just didn't last and you didn't have money to get more. : Sometimes true  Transportation Needs: Unmet Transportation Needs (08/27/2023)   Received from [facility]    In the past 12 months, has lack of reliable transportation kept you from medical appointments, meetings, work or from getting things needed for daily living? : Yes  Physical Activity: Not on file  Stress: Not on file  Social Connections: Not on file    Review of Systems Per HPI  Objective:  BP 120/84   Pulse 83   Ht 4' 11 (1.499 m)   Wt 185 lb (83.9 kg)   SpO2 98%   BMI 37.37 kg/m      11/14/2023    1:05 PM 08/24/2023    2:17 PM 08/24/2023    2:16 PM  BP/Weight  Systolic BP 120 131   Diastolic BP 84 91   Wt. (Lbs) 185  165  BMI 37.37 kg/m2  33.33 kg/m2  Physical Exam Vitals and nursing note reviewed.  Constitutional:      General: She is not in acute distress.    Appearance: Normal appearance.  HENT:     Head: Normocephalic and atraumatic.  Eyes:     General:        Right eye: No discharge.        Left eye: No discharge.     Conjunctiva/sclera: Conjunctivae normal.  Cardiovascular:     Rate and Rhythm: Normal rate and regular rhythm.  Pulmonary:     Effort: Pulmonary effort is normal.     Breath sounds: Normal breath sounds.  Neurological:     Mental Status: She is alert.  Psychiatric:        Mood and Affect: Mood normal.        Behavior: Behavior normal.     Lab Results  Component Value Date   WBC 5.6 11/12/2022   HGB 13.8 11/12/2022   HCT 43.1 11/12/2022   PLT 320 11/12/2022   GLUCOSE 100 (H) 11/12/2022   ALT 20 11/12/2022   AST 23 11/12/2022   NA 137  11/12/2022   K 4.1 11/12/2022   CL 101 11/12/2022   CREATININE 0.80 11/12/2022   BUN 12 11/12/2022   CO2 29 11/12/2022     Assessment & Plan:  POTS (postural orthostatic tachycardia syndrome) Assessment & Plan: Advised to consider pharmacotherapy: Beta-blocker, fludrocortisone.  Ensure adequate hydration.  Increase salt intake.  Compression stockings.  Labs obtained as requested.   Chronic fatigue -     Iron, TIBC and Ferritin Panel -     VITAMIN D 25 Hydroxy (Vit-D Deficiency, Fractures) -     Vitamin B12  Vitamin D deficiency Assessment & Plan: Vitamin D was mildly low.  Recommended over-the-counter vitamin D 3 2000 IU daily.   Iron deficiency Assessment & Plan: Iron panel returned with low ferritin.  Sending in iron supplementation.  Orders: -     Iron (Ferrous Sulfate); Take 325 mg by mouth every other day.  Dispense: 45 tablet; Refill: 1    Follow-up:  Return in about 6 months (around 05/13/2024).  Jacqulyn Ahle DO [facility]

## 2023-11-15 NOTE — Assessment & Plan Note (Signed)
 Vitamin D was mildly low.  Recommended over-the-counter vitamin D 3 2000 IU daily.

## 2023-11-15 NOTE — Assessment & Plan Note (Signed)
 Iron panel returned with low ferritin.  Sending in iron supplementation.

## 2024-05-14 ENCOUNTER — Ambulatory Visit: Admitting: Family Medicine
